# Patient Record
Sex: Female | Born: 1975 | Race: White | Hispanic: No | Smoking: Never smoker
Health system: Southern US, Community
[De-identification: ages and names within clinical notes are randomized; demographics above are authoritative.]

## PROBLEM LIST (undated history)

## (undated) DIAGNOSIS — R091 Pleurisy: Secondary | ICD-10-CM

## (undated) DIAGNOSIS — T300 Burn of unspecified body region, unspecified degree: Secondary | ICD-10-CM

## (undated) HISTORY — DX: Pleurisy: R09.1

## (undated) HISTORY — DX: Burn of unspecified body region, unspecified degree: T30.0

## (undated) HISTORY — PX: LEG SURGERY: SHX1003

---

## 1999-08-04 ENCOUNTER — Other Ambulatory Visit: Admission: RE | Admit: 1999-08-04 | Discharge: 1999-08-04 | Payer: Self-pay | Admitting: Obstetrics and Gynecology

## 2001-08-09 ENCOUNTER — Other Ambulatory Visit: Admission: RE | Admit: 2001-08-09 | Discharge: 2001-08-09 | Payer: Self-pay | Admitting: Obstetrics and Gynecology

## 2001-09-04 ENCOUNTER — Ambulatory Visit (HOSPITAL_COMMUNITY): Admission: RE | Admit: 2001-09-04 | Discharge: 2001-09-04 | Payer: Self-pay | Admitting: Obstetrics and Gynecology

## 2001-09-04 ENCOUNTER — Encounter: Payer: Self-pay | Admitting: Obstetrics and Gynecology

## 2002-01-26 ENCOUNTER — Inpatient Hospital Stay (HOSPITAL_COMMUNITY): Admission: AD | Admit: 2002-01-26 | Discharge: 2002-01-28 | Payer: Self-pay | Admitting: Obstetrics and Gynecology

## 2002-01-26 ENCOUNTER — Encounter (INDEPENDENT_AMBULATORY_CARE_PROVIDER_SITE_OTHER): Payer: Self-pay | Admitting: *Deleted

## 2005-04-21 ENCOUNTER — Other Ambulatory Visit: Admission: RE | Admit: 2005-04-21 | Discharge: 2005-04-21 | Payer: Self-pay | Admitting: Obstetrics and Gynecology

## 2007-05-22 ENCOUNTER — Emergency Department (HOSPITAL_COMMUNITY): Admission: EM | Admit: 2007-05-22 | Discharge: 2007-05-22 | Payer: Self-pay | Admitting: Emergency Medicine

## 2009-12-06 ENCOUNTER — Emergency Department: Payer: Self-pay | Admitting: Unknown Physician Specialty

## 2010-09-04 NOTE — Op Note (Signed)
Wendy Terry, Wendy Terry                         ACCOUNT NO.:  [id number]   MEDICAL RECORD NO.:  [id number]                   PATIENT TYPE:  INP   LOCATION:  9146                                 FACILITY:  [facility]   PHYSICIAN:  Huel Cote, M.D.              DATE OF BIRTH:  03-17-1976   DATE OF PROCEDURE:  01/26/2002  DATE OF DISCHARGE:                                 OPERATIVE REPORT   PREOPERATIVE DIAGNOSES:  1. Term pregnancy at 38 weeks.  2. Breech presentation.  3. Labor.   POSTOPERATIVE DIAGNOSES:  1. Term pregnancy at 38 weeks.  2. Breech presentation.  3. Labor.   PROCEDURE:  Primary low transverse cesarean section with bilateral tubal  ligation.   SURGEON:  Huel Cote, M.D.   ANESTHESIA:  Spinal.   ESTIMATED BLOOD LOSS:  800 cc.   URINE OUTPUT:  450 cc.   IV FLUIDS:  3000 cc.   FINDINGS:  There is a viable female infant in the complete breech  presentation.  Apgars were 8 and 9.  Weight was 8 pounds 4 ounces.  There  were normal uterus, tubes, and ovaries noted.  A bilateral tubal ligation  was done per the patient's request after confirmation with her.   PROCEDURE:  The patient was taken to the operating room where spinal  anesthesia was obtained without difficulty.  She was then prepped and draped  in the normal sterile fashion in the dorsal supine position with a leftward  tilt.  A Pfannenstiel skin incision was then made and carried through to the  underlying layer of fascia with a scalpel and Bovie cautery and the fascia  was then nicked in the midline and the incision was extended laterally with  Mayo scissors.  The inferior aspect of the fascia was then grasped with  Kocher clamps, elevated, and dissected off the underlying rectus muscles  with Bovie cautery.  In a similar fashion, the superior aspect was elevated  and dissected off the rectus muscles.  The rectus muscles were then  separated in the midline and the peritoneum entered bluntly.   Peritoneal  incision was then extended both superiorly and inferiorly with careful  attention to avoid both bowel and bladder.  The bladder blade was then  inserted.  The vesicouterine peritoneum identified, and a bladder flap  created with the Metzenbaum scissors.  The bladder blade was then inserted  under the bladder flap and the lower uterine segment incised in a transverse  fashion.  The infant's feet were then grasped and gentle traction placed  until the legs delivered up to the level of the sacrum.  Baby was then  rotated with fundal pressure and gentle traction and the arms reduced over  the infant's chest.  Finally, the head was held in a flexed position and  delivered without difficulty.  The cord was clamped and cut, baby bulb  suctioned and handed to the  pediatricians.  Cord blood was obtained and the  placenta was expressed manually.  The uterus was cleared of all clots and  debris with a moist lap sponge.  The uterine incision was then repaired with  0 chromic in a running locked fashion.  Good hemostasis was noted so  attention was turned to the patient's left fallopian tube which was  identified and grasped with a Babcock clamp and a small opening made in the  mesosalpinx with Bovie cautery.  A plain tie of 0 was then doubly placed  around approximately 2-3 buckle of tube and this was amputated with the  Metzenbaum scissors and handed off to pathology.  The freed pedicle was  cauterized with Bovie cautery and noted to be hemostatic.  Attention was  then turned to the right fallopian tube which in a similar fashion was  grasped with Babcock clamp, elevated, and a 2-3 cm segment tied off with two  ties of 0 plain.  This also was then amputated and handed to pathology and  the free pedicles cauterized with Bovie cautery.  Both were hemostatic and  returned to the abdomen.  The uterine incision was then carefully inspected  and found to be hemostatic.  The rectus muscles and  subfascial planes were  inspected and found to be hemostatic.  Therefore, the fascia was closed with  0 Vicryl in a running fashion.  The skin was closed with staples.  Sponge,  lap, and needle counts were correct x2 and the patient was taken to the  recovery room in stable condition.                                               Huel Cote, M.D.    KR/MEDQ  D:  01/26/2002  T:  01/26/2002  Job:  [id number]
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
## 2011-01-08 LAB — URINALYSIS, ROUTINE W REFLEX MICROSCOPIC
Bilirubin Urine: NEGATIVE
Glucose, UA: NEGATIVE
Hgb urine dipstick: NEGATIVE
Ketones, ur: NEGATIVE
Nitrite: NEGATIVE
Protein, ur: NEGATIVE
Specific Gravity, Urine: 1.019
Urobilinogen, UA: 1
pH: 7

## 2011-01-08 LAB — WET PREP, GENITAL
Clue Cells Wet Prep HPF POC: NONE SEEN
Trich, Wet Prep: NONE SEEN
WBC, Wet Prep HPF POC: NONE SEEN

## 2011-01-08 LAB — POCT PREGNANCY, URINE
Operator id: 277751
Preg Test, Ur: NEGATIVE

## 2016-10-17 ENCOUNTER — Encounter: Payer: Self-pay | Admitting: Emergency Medicine

## 2016-10-17 ENCOUNTER — Emergency Department: Payer: Self-pay

## 2016-10-17 ENCOUNTER — Emergency Department
Admission: EM | Admit: 2016-10-17 | Discharge: 2016-10-17 | Disposition: A | Discharge status: Home / Self Care | Payer: Self-pay | Attending: Emergency Medicine | Admitting: Emergency Medicine

## 2016-10-17 DIAGNOSIS — R0781 Pleurodynia: Secondary | ICD-10-CM | POA: Insufficient documentation

## 2016-10-17 LAB — BASIC METABOLIC PANEL
ANION GAP: 5 (ref 5–15)
BUN: 13 mg/dL (ref 6–20)
CHLORIDE: 105 mmol/L (ref 101–111)
CO2: 27 mmol/L (ref 22–32)
Calcium: 8.8 mg/dL — ABNORMAL LOW (ref 8.9–10.3)
Creatinine, Ser: 0.73 mg/dL (ref 0.44–1.00)
GFR calc Af Amer: >60 mL/min (ref >60)
GFR calc non Af Amer: >60 mL/min (ref >60)
Glucose, Bld: 102 mg/dL — ABNORMAL HIGH (ref 65–99)
POTASSIUM: 3.4 mmol/L — AB (ref 3.5–5.1)
Sodium: 137 mmol/L (ref 135–145)

## 2016-10-17 LAB — TROPONIN I

## 2016-10-17 LAB — CBC
HCT: 36.7 % (ref 35.0–47.0)
HEMOGLOBIN: 12.6 g/dL (ref 12.0–16.0)
MCH: 30.5 pg (ref 26.0–34.0)
MCHC: 34.3 g/dL (ref 32.0–36.0)
MCV: 88.9 fL (ref 80.0–100.0)
Platelets: 282 10*3/uL (ref 150–440)
RBC: 4.13 MIL/uL (ref 3.80–5.20)
RDW: 13.3 % (ref 11.5–14.5)
WBC: 10.1 10*3/uL (ref 3.6–11.0)

## 2016-10-17 LAB — POCT PREGNANCY, URINE: Preg Test, Ur: NEGATIVE

## 2016-10-17 MED ORDER — HYDROCODONE-ACETAMINOPHEN 5-325 MG PO TABS: 1.0000 | ORAL_TABLET | ORAL | 0 refills | Status: DC | PRN

## 2016-10-17 MED ORDER — METHYLPREDNISOLONE SODIUM SUCC 125 MG IJ SOLR
INTRAMUSCULAR | Status: AC
  Administered 2016-10-17: 125 mg via INTRAVENOUS
  Filled 2016-10-17: qty 2

## 2016-10-17 MED ORDER — METHYLPREDNISOLONE SODIUM SUCC 125 MG IJ SOLR
125.0000 mg | Freq: Once | INTRAMUSCULAR | Status: AC
  Administered 2016-10-17: 125 mg via INTRAVENOUS

## 2016-10-17 MED ORDER — METHYLPREDNISOLONE SODIUM SUCC 125 MG IJ SOLR: 125.0000 mg | Freq: Once | INTRAMUSCULAR | 0 refills | Status: DC

## 2016-10-17 MED ORDER — PREDNISONE 10 MG (21) PO TBPK: ORAL_TABLET | ORAL | 0 refills | Status: DC

## 2016-10-17 MED ORDER — IOPAMIDOL (ISOVUE-370) INJECTION 76%
75.0000 mL | Freq: Once | INTRAVENOUS | Status: AC | PRN
  Administered 2016-10-17: 75 mL via INTRAVENOUS
  Filled 2016-10-17: qty 75
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
## 2016-10-17 NOTE — ED Triage Notes (Signed)
C/O mid back pain that radiates toward sternum.  [facility] and [facility] rides.  States mid back pain started first and then pain radiated toward sternum.  States pain worsens with movement and cough and deep breathing.

## 2016-10-17 NOTE — Discharge Instructions (Signed)
Please take the medications as prescribed. Follow up with the primary care provider of your choice when possible. Return to the ER for symptoms that change or worsen if unable to schedule an appointment.

## 2016-10-17 NOTE — ED Notes (Signed)
Patient transported to CT 

## 2017-03-01 ENCOUNTER — Ambulatory Visit (INDEPENDENT_AMBULATORY_CARE_PROVIDER_SITE_OTHER): Payer: Worker's Compensation | Admitting: Family Medicine

## 2017-03-01 ENCOUNTER — Other Ambulatory Visit: Payer: Self-pay

## 2017-03-01 ENCOUNTER — Encounter: Payer: Self-pay | Admitting: Family Medicine

## 2017-03-01 VITALS — BP 104/68 | HR 90 | Temp 98.0°F | Resp 12 | Ht 63.0 in | Wt 154.0 lb

## 2017-03-01 DIAGNOSIS — M79672 Pain in left foot: Secondary | ICD-10-CM | POA: Diagnosis not present

## 2017-03-01 DIAGNOSIS — T22292A Burn of second degree of multiple sites of left shoulder and upper limb, except wrist and hand, initial encounter: Secondary | ICD-10-CM

## 2017-03-01 DIAGNOSIS — M25522 Pain in left elbow: Secondary | ICD-10-CM

## 2017-03-01 DIAGNOSIS — T25229A Burn of second degree of unspecified foot, initial encounter: Secondary | ICD-10-CM

## 2017-03-01 MED ORDER — HYDROCODONE-ACETAMINOPHEN 5-325 MG PO TABS: 1.0000 | ORAL_TABLET | ORAL | 0 refills | Status: DC | PRN

## 2017-03-01 NOTE — Progress Notes (Addendum)
Subjective:  By signing my name below, I, Wendy Terry, attest that this documentation has been prepared under the direction and in the presence of [name], MD Electronically Signed: Charline Bills, ED Scribe 03/01/2017 at 3:45 PM.   Patient ID: Wendy Terry, female    DOB: 11-Dec-1975, 41 y.o.   MRN: [id number]  Chief Complaint  Patient presents with  . Burn    x6 days ago. Burnt left arm and left foot, some of right foot. Out of cream, out of pain medication. Very painful at burn sites. Rates pain at a 10/10. Needles/burning pain sensation. Burnt self at work making transferring apple cider to customer pot. Pot was jerked out of patient hand, dumped down left side. Ambulance transferred patient to [facility] ED. Returns to work on 11/19- would like work note and detailed wound care instructions.      HPI Wendy Terry is a 41 y.o. female who presents to Primary Care at [facility] complaining of multiple wounds that she sustained at work. Burns occurred 6 days ago while working at the [employer]. Pt states that she was making boiling apple cider samples to a customer pot when the cord fell and caught on the wheel, dumping the cider onto her L arm, L foot, R foot and splashing cider in her hair. She states the cider was unplugged but she could see bubbling within the cider even while it was unplugged and in the [employer]. Pt was wearing a long-sleeve with t-shirt, thin socks and tennis shoes during the time of the incident. EMS transported pt to [facility] where she was evaluated by a burn specialist and she was discharged home. Pt's tetanus was updated and she was started on silvadene cream twice daily and washes the area once daily. Pt has a picture on her cell phone with some scatted blistering across the upper and lower forearm. States she did notice some immediate blistering to the L foot but states she has recently noticed more blisters and drainage from the burn to the L foot over  the past few days. Reports increased pain to the L foot that she describes as a burning sensation with bearing weight. Also reports blistering to the L arm, small burn to the R great toe and minimal burn that she describes a "sun burn" to the face. Denies burns to mouth or eyes, sob. Pt is treating pain with Norco 5-325 x 4 times daily and ibuprofen 600 mg every 6 hours. Denies h/o substance abuse or difficulty stopping narcotic pain medication.  Current Outpatient Medications on File Prior to Visit  Medication Sig Dispense Refill  . BIOTIN PO Take daily by mouth.    Marland Kitchen HYDROcodone-acetaminophen (NORCO/VICODIN) 5-325 MG tablet Take 1 tablet by mouth every 4 (four) hours as needed for moderate pain. 20 tablet 0  . ibuprofen (ADVIL,MOTRIN) 200 MG tablet Take 200 mg 2 (two) times daily as needed by mouth.    . Multiple Vitamins-Minerals (MULTIVITAMIN PO) Take daily by mouth.    . silver sulfADIAZINE (SILVADENE) 1 % cream Apply 1 application 2 (two) times daily topically.    . predniSONE (STERAPRED UNI-PAK 21 TAB) 10 MG (21) TBPK tablet Take 6 tablets on day 1 Take 5 tablets on day 2 Take 4 tablets on day 3 Take 3 tablets on day 4 Take 2 tablets on day 5 Take 1 tablet on day 6 (Patient not taking: Reported on 03/01/2017) 21 tablet 0   No current facility-administered medications on file prior  to visit.    No Known Allergies  Review of Systems  HENT: Negative for mouth sores.   Eyes: Negative for pain, redness and visual disturbance.  Respiratory: Negative for shortness of breath and wheezing.   Skin: Positive for color change and wound.      Objective:   Physical Exam  Constitutional: She is oriented to person, place, and time. She appears well-developed and well-nourished. No distress.  HENT:  Head: Normocephalic and atraumatic.  Eyes: Conjunctivae and EOM are normal.  Neck: Neck supple. No tracheal deviation present.  Cardiovascular: Normal rate.  Pulmonary/Chest: Effort normal. No  respiratory distress.  Musculoskeletal: Normal range of motion.  Neurological: She is alert and oriented to person, place, and time.  Skin: Skin is warm and dry. Burn noted.  Face: no facial wounds or burns L UE: appears to be healing burn across the medial aspect and volar aspect of upper and volar L forearm. Some areas with overlying blister that has ruptured. L hand is uninvolved.  L foot: small erythematous patch over lateral malleolus. Larger patches of erythema with larger blister into dorsal toes 1-5. Some overlying blister over 1-5 toes proximal to nail. Distal toes are uninvolved. Slight soft tissue swelling of the 2nd-4th toes. Cap refill less than 1 second. Tips of toes are warm. R foot: patch of erythema overlying the great toe to the MTP joint. Remainder of the lower legs are uninvolved.  Psychiatric: She has a normal mood and affect. Her behavior is normal.  Nursing note and vitals reviewed.        Vitals:   03/01/17 1512  BP: 104/68  Pulse: 90  Resp: 12  Temp: 98 F (36.7 C)  TempSrc: Oral  SpO2: 97%  Weight: 154 lb (69.9 kg)  Height: 5\' 3"  (1.6 m)      Assessment & Plan:    Wendy OverlandJasmine N Pandit is a 41 y.o. female Arthralgia of left upper arm  Left foot pain  Partial thickness burn of multiple sites of left upper extremity, initial encounter  Burn, foot, second degree, unspecified laterality, initial encounter  Partial-thickness burns with resultant pain to left arm, left foot, small amount to right foot, and reportedly mild burn of left side of face that has now resolved. Injuries due to incident while at work. She does report receiving tetanus at the ER visit. She also was [facility] with plan for outpatient treatment. Most blisters across arm have ruptured, remaining blister across dorsal foot. No apparent signs of infection in wounds at this time. Fair pain control, but has only been using one hydrocodone at a time  has been continuing ibuprofen 600 mg.   -Gently cleansed wound of foot with reapplication of Silvadene to foot and arm wounds. Discussed wound care of cleansing area 1-2 times per day with reapplication of Silvadene, Telfa and Kerlix/gauze bandages   -For pain control, Continue ibuprofen 600 mg every 6 hours with food, but can increase hydrocodone up to 2 at a time if needed, new prescription provided. Potential side effects discussed.  -Agree with continuing to remain out of work at this time with repeat evaluation in 3 days.   -RTC/ER precautions discussed if worsening  Meds ordered this encounter  Medications  . ibuprofen (ADVIL,MOTRIN) 200 MG tablet    Sig: Take 200 mg 2 (two) times daily as needed by mouth.  . DISCONTD: silver sulfADIAZINE (SILVADENE) 1 % cream    Sig: Apply 1 application 2 (two)  times daily topically.  . Multiple Vitamins-Minerals (MULTIVITAMIN PO)    Sig: Take daily by mouth.  Marland Kitchen. BIOTIN PO    Sig: Take daily by mouth.  Marland Kitchen. HYDROcodone-acetaminophen (NORCO/VICODIN) 5-325 MG tablet    Sig: Take 1-2 tablets every 4 (four) hours as needed by mouth for moderate pain.    Dispense:  30 tablet    Refill:  0   Patient Instructions    Continue cleansing with soap and water twice per day, then silvadene cream.  Ibuprofen 600mg  every 6 hours, then hydrocodone up to every 4 hours as needed. recheck in 3 days to repeat exam on foot. Return to the clinic or go to the nearest emergency room if any of your symptoms worsen or new symptoms occur.   Burn Care, Adult A burn is an injury to the skin or the tissues under the skin. There are three types of burns:  First degree. These burns may cause the skin to be red and slightly swollen.  Second degree. These burns are very painful and cause the skin to be very red. The skin may also leak fluid, look shiny, and develop blisters.  Third degree. These burns cause permanent damage. They either turn the skin white or black and make it  look charred, dry, and leathery.  Taking care of your burn properly can help to prevent pain and infection. It can also help the burn to heal more quickly. What are the risks? Complications from burns include:  Damage to the skin.  Reduced blood flow near the injury.  Dead tissue.  Scarring.  Problems with movement, if the burn happened near a joint or on the hands or feet.  Severe burns can lead to problems that affect the whole body, such as:  Fluid loss.  Less blood circulating in the body.  Inability to maintain a normal core body temperature (thermoregulation).  Infection.  Shock.  Problems breathing.  How to care for a first-degree burn Right after a burn:  Rinse or soak the burn under cool water until the pain stops. Do not put ice on your burn. This can cause more damage.  Lightly cover the burn with a sterile cloth (dressing). Burn care  Follow instructions from your health care provider about: ? How to clean and take care of the burn. ? When to change and remove the dressing.  Check your burn every day for signs of infection. Check for: ? More redness, swelling, or pain. ? Warmth. ? Pus or a bad smell. Medicine  Take over-the-counter and prescription medicines only as told by your health care provider.  If you were prescribed antibiotic medicine, take or apply it as told by your health care provider. Do not stop using the antibiotic even if your condition improves. General instructions  To prevent infection, do not put butter, oil, or other home remedies on your burn.  Do not rub your burn, even when you are cleaning it.  Protect your burn from the sun. How to care for a second-degree burn Right after a burn:  Rinse or soak the burn under cool water. Do this for several minutes. Do not put ice on your burn. This can cause more damage.  Lightly cover the burn with a sterile cloth (dressing). Burn care  Raise (elevate) the injured area above  the level of your heart while sitting or lying down.  Follow instructions from your health care provider about: ? How to clean and take care of the burn. ?  When to change and remove the dressing.  Check your burn every day for signs of infection. Check for: ? More redness, swelling, or pain. ? Warmth. ? Pus or a bad smell. Medicine   Take over-the-counter and prescription medicines only as told by your health care provider.  If you were prescribed antibiotic medicine, take or apply it as told by your health care provider. Do not stop using the antibiotic even if your condition improves. General instructions  To prevent infection: ? Do not put butter, oil, or other home remedies on the burn. ? Do not scratch or pick at the burn. ? Do not break any blisters. ? Do not peel skin.  Do not rub your burn, even when you are cleaning it.  Protect your burn from the sun. How to care for a third-degree burn Right after a burn:  Lightly cover the burn with gauze.  Seek immediate medical attention. Burn care  Raise (elevate) the injured area above the level of your heart while sitting or lying down.  Drink enough fluid to keep your urine clear or pale yellow.  Rest as told by your health care provider. Do not participate in sports or other physical activities until your health care provider approves.  Follow instructions from your health care provider about: ? How to clean and take care of the burn. ? When to change and remove the dressing.  Check your burn every day for signs of infection. Check for: ? More redness, swelling, or pain. ? Warmth. ? Pus or a bad smell. Medicine  Take over-the-counter and prescription medicines only as told by your health care provider.  If you were prescribed antibiotic medicine, take or apply it as told by your health care provider. Do not stop using the antibiotic even if your condition improves. General instructions  To prevent  infection: ? Do not put butter, oil, or other home remedies on the burn. ? Do not scratch or pick at the burn. ? Do not break any blisters. ? Do not peel skin. ? Do not rub your burn, even when you are cleaning it.  Protect your burn from the sun.  Keep all follow-up visits as told by your health care provider. This is important. Contact a health care provider if:  Your condition does not improve.  Your condition gets worse.  You have a fever.  Your burn changes in appearance or develops black or red spots.  Your burn feels warm to the touch.  Your pain is not controlled with medicine. Get help right away if:  You have redness, swelling, or pain at the site of the burn.  You have fluid, blood, or pus coming from your burn.  You have red streaks near the burn.  You have severe pain. This information is not intended to replace advice given to you by your health care provider. Make sure you discuss any questions you have with your health care provider. Document Released: 04/05/2005 Document Revised: 10/26/2015 Document Reviewed: 09/23/2015 Elsevier Interactive Patient Education  2018 [employer].     IF you received an x-ray today, you will receive an invoice from [facility]. Please contact [facility] at [phone] with questions or concerns regarding your invoice.   IF you received labwork today, you will receive an invoice from [place]. Please contact [facility] at [phone] with questions or concerns regarding your invoice.   Our billing staff will not be able to assist you with questions regarding bills from these companies.  You will be contacted with the lab results as soon as they are available. The fastest way to get your results is to activate your My Chart account. Instructions are located on the last page of this paperwork. If you have not heard from [place] regarding the results in 2 weeks, please contact this office.      I personally  performed the services described in this documentation, which was scribed in my presence. The recorded information has been reviewed and considered for accuracy and completeness, addended by me as needed, and agree with information above.  Signed,   Meredith Staggers, MD Primary Care at [facility].  03/01/17 4:19 PM

## 2017-03-01 NOTE — Patient Instructions (Addendum)
Continue cleansing with soap and water twice per day, then silvadene cream.  Ibuprofen 600mg  every 6 hours, then hydrocodone up to every 4 hours as needed. recheck in 3 days to repeat exam on foot. Return to the clinic or go to the nearest emergency room if any of your symptoms worsen or new symptoms occur.   Burn Care, Adult A burn is an injury to the skin or the tissues under the skin. There are three types of burns:  First degree. These burns may cause the skin to be red and slightly swollen.  Second degree. These burns are very painful and cause the skin to be very red. The skin may also leak fluid, look shiny, and develop blisters.  Third degree. These burns cause permanent damage. They either turn the skin white or black and make it look charred, dry, and leathery.  Taking care of your burn properly can help to prevent pain and infection. It can also help the burn to heal more quickly. What are the risks? Complications from burns include:  Damage to the skin.  Reduced blood flow near the injury.  Dead tissue.  Scarring.  Problems with movement, if the burn happened near a joint or on the hands or feet.  Severe burns can lead to problems that affect the whole body, such as:  Fluid loss.  Less blood circulating in the body.  Inability to maintain a normal core body temperature (thermoregulation).  Infection.  Shock.  Problems breathing.  How to care for a first-degree burn Right after a burn:  Rinse or soak the burn under cool water until the pain stops. Do not put ice on your burn. This can cause more damage.  Lightly cover the burn with a sterile cloth (dressing). Burn care  Follow instructions from your health care provider about: ? How to clean and take care of the burn. ? When to change and remove the dressing.  Check your burn every day for signs of infection. Check for: ? More redness, swelling, or pain. ? Warmth. ? Pus or a bad  smell. Medicine  Take over-the-counter and prescription medicines only as told by your health care provider.  If you were prescribed antibiotic medicine, take or apply it as told by your health care provider. Do not stop using the antibiotic even if your condition improves. General instructions  To prevent infection, do not put butter, oil, or other home remedies on your burn.  Do not rub your burn, even when you are cleaning it.  Protect your burn from the sun. How to care for a second-degree burn Right after a burn:  Rinse or soak the burn under cool water. Do this for several minutes. Do not put ice on your burn. This can cause more damage.  Lightly cover the burn with a sterile cloth (dressing). Burn care  Raise (elevate) the injured area above the level of your heart while sitting or lying down.  Follow instructions from your health care provider about: ? How to clean and take care of the burn. ? When to change and remove the dressing.  Check your burn every day for signs of infection. Check for: ? More redness, swelling, or pain. ? Warmth. ? Pus or a bad smell. Medicine   Take over-the-counter and prescription medicines only as told by your health care provider.  If you were prescribed antibiotic medicine, take or apply it as told by your health care provider. Do not stop using the antibiotic even if  your condition improves. General instructions  To prevent infection: ? Do not put butter, oil, or other home remedies on the burn. ? Do not scratch or pick at the burn. ? Do not break any blisters. ? Do not peel skin.  Do not rub your burn, even when you are cleaning it.  Protect your burn from the sun. How to care for a third-degree burn Right after a burn:  Lightly cover the burn with gauze.  Seek immediate medical attention. Burn care  Raise (elevate) the injured area above the level of your heart while sitting or lying down.  Drink enough fluid to keep  your urine clear or pale yellow.  Rest as told by your health care provider. Do not participate in sports or other physical activities until your health care provider approves.  Follow instructions from your health care provider about: ? How to clean and take care of the burn. ? When to change and remove the dressing.  Check your burn every day for signs of infection. Check for: ? More redness, swelling, or pain. ? Warmth. ? Pus or a bad smell. Medicine  Take over-the-counter and prescription medicines only as told by your health care provider.  If you were prescribed antibiotic medicine, take or apply it as told by your health care provider. Do not stop using the antibiotic even if your condition improves. General instructions  To prevent infection: ? Do not put butter, oil, or other home remedies on the burn. ? Do not scratch or pick at the burn. ? Do not break any blisters. ? Do not peel skin. ? Do not rub your burn, even when you are cleaning it.  Protect your burn from the sun.  Keep all follow-up visits as told by your health care provider. This is important. Contact a health care provider if:  Your condition does not improve.  Your condition gets worse.  You have a fever.  Your burn changes in appearance or develops black or red spots.  Your burn feels warm to the touch.  Your pain is not controlled with medicine. Get help right away if:  You have redness, swelling, or pain at the site of the burn.  You have fluid, blood, or pus coming from your burn.  You have red streaks near the burn.  You have severe pain. This information is not intended to replace advice given to you by your health care provider. Make sure you discuss any questions you have with your health care provider. Document Released: 04/05/2005 Document Revised: 10/26/2015 Document Reviewed: 09/23/2015 Elsevier Interactive Patient Education  2018 [employer]     IF you received an x-ray  today, you will receive an invoice from [facility]. Please contact [facility] at [phone] with questions or concerns regarding your invoice.   IF you received labwork today, you will receive an invoice from [facility]. Please contact [facility] at [phone] with questions or concerns regarding your invoice.   Our billing staff will not be able to assist you with questions regarding bills from these companies.  You will be contacted with the lab results as soon as they are available. The fastest way to get your results is to activate your My Chart account. Instructions are located on the last page of this paperwork. If you have not heard from us regarding the results in 2 weeks, please contact this office.

## 2017-03-04 ENCOUNTER — Telehealth: Payer: Self-pay

## 2017-03-04 ENCOUNTER — Encounter: Payer: Self-pay | Admitting: Family Medicine

## 2017-03-04 ENCOUNTER — Ambulatory Visit (INDEPENDENT_AMBULATORY_CARE_PROVIDER_SITE_OTHER): Payer: Worker's Compensation | Admitting: Family Medicine

## 2017-03-04 VITALS — BP 118/70 | HR 90 | Temp 98.1°F | Resp 17 | Ht 63.0 in | Wt 153.0 lb

## 2017-03-04 DIAGNOSIS — M79672 Pain in left foot: Secondary | ICD-10-CM | POA: Diagnosis not present

## 2017-03-04 DIAGNOSIS — T22292D Burn of second degree of multiple sites of left shoulder and upper limb, except wrist and hand, subsequent encounter: Secondary | ICD-10-CM | POA: Diagnosis not present

## 2017-03-04 DIAGNOSIS — T25222D Burn of second degree of left foot, subsequent encounter: Secondary | ICD-10-CM

## 2017-03-04 NOTE — Patient Instructions (Addendum)
  Continue Silvadene with wound cleansing 1-2 times per day, ibuprofen, and hydrocodone if needed. You can gently remove the dead skin around the wounds. Recheck in 4 days.  Return to the clinic or go to the nearest emergency room if any of your symptoms worsen or new symptoms occur.   IF you received an x-ray today, you will receive an invoice from [facility]. Please contact [facility] at [phone] with questions or concerns regarding your invoice.   IF you received labwork today, you will receive an invoice from [facility]. Please contact [facility] at [phone] with questions or concerns regarding your invoice.   Our billing staff will not be able to assist you with questions regarding bills from these companies.  You will be contacted with the lab results as soon as they are available. The fastest way to get your results is to activate your My Chart account. Instructions are located on the last page of this paperwork. If you have not heard from us regarding the results in 2 weeks, please contact this office.

## 2017-03-04 NOTE — Telephone Encounter (Signed)
Copied from CRM [phone]. Topic: Medical Record Request - Provider/Facility Request >> Mar 04, 2017  2:35 PM Terisa Starraylor, Brittany L wrote: Patient Name/DOB/MRN #: Wendy Terry 1975/07/12, [id number] Requestor Name/Agency: [facility] Call Back #: fax [phone] Information Requested: Work Status & Notes from visit   Route to [facility] Pool for [facility]. For all other clinics, route to the clinic's PEC Pool.
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
## 2017-03-05 ENCOUNTER — Encounter: Payer: Self-pay | Admitting: Family Medicine

## 2017-03-08 ENCOUNTER — Other Ambulatory Visit: Payer: Self-pay

## 2017-03-08 ENCOUNTER — Ambulatory Visit (INDEPENDENT_AMBULATORY_CARE_PROVIDER_SITE_OTHER): Payer: Worker's Compensation | Admitting: Family Medicine

## 2017-03-08 ENCOUNTER — Encounter: Payer: Self-pay | Admitting: Family Medicine

## 2017-03-08 VITALS — BP 116/68 | HR 91 | Resp 16 | Ht 63.0 in | Wt 153.0 lb

## 2017-03-08 DIAGNOSIS — T25222D Burn of second degree of left foot, subsequent encounter: Secondary | ICD-10-CM | POA: Diagnosis not present

## 2017-03-08 DIAGNOSIS — L03119 Cellulitis of unspecified part of limb: Secondary | ICD-10-CM

## 2017-03-08 DIAGNOSIS — M25572 Pain in left ankle and joints of left foot: Secondary | ICD-10-CM | POA: Diagnosis not present

## 2017-03-08 DIAGNOSIS — T22292D Burn of second degree of multiple sites of left shoulder and upper limb, except wrist and hand, subsequent encounter: Secondary | ICD-10-CM | POA: Diagnosis not present

## 2017-03-08 DIAGNOSIS — M79602 Pain in left arm: Secondary | ICD-10-CM

## 2017-03-08 MED ORDER — HYDROCODONE-ACETAMINOPHEN 5-325 MG PO TABS: 1.0000 | ORAL_TABLET | ORAL | 0 refills | Status: DC | PRN

## 2017-03-08 MED ORDER — SILVER SULFADIAZINE 1 % EX CREA: 1.0000 "application " | TOPICAL_CREAM | Freq: Every day | CUTANEOUS | 0 refills | Status: DC

## 2017-03-08 NOTE — Patient Instructions (Addendum)
Left arm is looking much better, but unfortunately the burn on your left foot appears to be getting infected. Due to potential need for debridement and pain control for that debridement I would recommend proceeding to [facility] emergency room. You can call the burn center first to see if they have other specific instructions. I did not start you on an antibiotic today as that decision and type of antibiotic can be decided by provider treating your wound tonight.    For arm -  continue Silvadene cream twice per day for next 3 days after cleansing with sopa and water. As open area closes, change to over the counter polysporin. Continue ibuprofen 600 mg every 6 hours as needed, continue hydrocodone 1-2 pills every 6 hours as needed.   Use crutches to keep weight off foot, recheck in 3 days.   IF you received an x-ray today, you will receive an invoice from [facility]. Please contact [facility] at [phone] with questions or concerns regarding your invoice.   IF you received labwork today, you will receive an invoice from [facility]. Please contact [facility] at [phone] with questions or concerns regarding your invoice.   Our billing staff will not be able to assist you with questions regarding bills from these companies.  You will be contacted with the lab results as soon as they are available. The fastest way to get your results is to activate your My Chart account. Instructions are located on the last page of this paperwork. If you have not heard from us regarding the results in 2 weeks, please contact this office.
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
## 2017-03-09 ENCOUNTER — Other Ambulatory Visit: Payer: Self-pay | Admitting: Family Medicine

## 2017-03-09 ENCOUNTER — Encounter: Payer: Self-pay | Admitting: Family Medicine

## 2017-03-09 NOTE — Telephone Encounter (Signed)
Records were sent to them on 03/09/17

## 2017-03-11 ENCOUNTER — Other Ambulatory Visit: Payer: Self-pay

## 2017-03-11 ENCOUNTER — Encounter: Payer: Self-pay | Admitting: Family Medicine

## 2017-03-11 ENCOUNTER — Ambulatory Visit (INDEPENDENT_AMBULATORY_CARE_PROVIDER_SITE_OTHER): Payer: Worker's Compensation | Admitting: Family Medicine

## 2017-03-11 VITALS — BP 102/68 | HR 89 | Temp 98.0°F | Resp 16

## 2017-03-11 DIAGNOSIS — T22292D Burn of second degree of multiple sites of left shoulder and upper limb, except wrist and hand, subsequent encounter: Secondary | ICD-10-CM

## 2017-03-11 DIAGNOSIS — M25572 Pain in left ankle and joints of left foot: Secondary | ICD-10-CM

## 2017-03-11 DIAGNOSIS — T25222D Burn of second degree of left foot, subsequent encounter: Secondary | ICD-10-CM | POA: Diagnosis not present

## 2017-03-11 DIAGNOSIS — T25221D Burn of second degree of right foot, subsequent encounter: Secondary | ICD-10-CM | POA: Diagnosis not present

## 2017-03-11 MED ORDER — IBUPROFEN 600 MG PO TABS: 600.0000 mg | ORAL_TABLET | Freq: Three times a day (TID) | ORAL | 0 refills | Status: DC | PRN

## 2017-03-11 MED ORDER — SILVER SULFADIAZINE 1 % EX CREA: 1.0000 "application " | TOPICAL_CREAM | Freq: Every day | CUTANEOUS | 0 refills | Status: DC

## 2017-03-11 MED ORDER — DOUBLE ANTIBIOTIC 500-10000 UNIT/GM EX OINT: 1.0000 "application " | TOPICAL_OINTMENT | Freq: Two times a day (BID) | CUTANEOUS | 0 refills | Status: DC

## 2017-03-11 NOTE — Patient Instructions (Addendum)
  Continue Silvadene to foot with cleansing 1-2 times per day, Silvadene to small slightly open area on elbow for the next few days, then change over to Polysporin. Can use Polysporin on other areas of left arm at this time. After cleansing, can gently trim dead skin from top of foot.   Continue ibuprofen 600 mg every 6 hours as needed, hydrocodone if needed for breakthrough pain. Follow-up in one week with Dr. Alvy BimlerSagardia  Return to the clinic or go to the nearest emergency room if any of your symptoms worsen or new symptoms occur.    IF you received an x-ray today, you will receive an invoice from [facility]. Please contact [facility] at [phone] with questions or concerns regarding your invoice.   IF you received labwork today, you will receive an invoice from [facility]. Please contact [facility] at [phone] with questions or concerns regarding your invoice.   Our billing staff will not be able to assist you with questions regarding bills from these companies.  You will be contacted with the lab results as soon as they are available. The fastest way to get your results is to activate your My Chart account. Instructions are located on the last page of this paperwork. If you have not heard from us regarding the results in 2 weeks, please contact this office.
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
## 2017-03-13 ENCOUNTER — Encounter: Payer: Self-pay | Admitting: Family Medicine

## 2017-03-14 ENCOUNTER — Telehealth: Payer: Self-pay

## 2017-03-14 NOTE — Telephone Encounter (Signed)
Copied from CRM [phone]. Topic: General - Other >> Mar 14, 2017 12:27 PM Windy KalataMichael, Taylor L, NT wrote: Reason for CRM: Georga HackingCary calling from workers comp would like the notes faxed to her from the appt on 11/23.   Fax number [phone]

## 2017-03-15 NOTE — Telephone Encounter (Signed)
Records faxed over on 03/15/17

## 2017-03-17 ENCOUNTER — Other Ambulatory Visit: Payer: Self-pay

## 2017-03-17 ENCOUNTER — Ambulatory Visit (INDEPENDENT_AMBULATORY_CARE_PROVIDER_SITE_OTHER): Payer: Worker's Compensation | Admitting: Emergency Medicine

## 2017-03-17 ENCOUNTER — Encounter: Payer: Self-pay | Admitting: Emergency Medicine

## 2017-03-17 VITALS — BP 100/60 | HR 92 | Temp 98.6°F | Resp 16 | Wt 156.8 lb

## 2017-03-17 DIAGNOSIS — T25222D Burn of second degree of left foot, subsequent encounter: Secondary | ICD-10-CM

## 2017-03-17 DIAGNOSIS — T22292D Burn of second degree of multiple sites of left shoulder and upper limb, except wrist and hand, subsequent encounter: Secondary | ICD-10-CM

## 2017-03-17 DIAGNOSIS — M25572 Pain in left ankle and joints of left foot: Secondary | ICD-10-CM | POA: Diagnosis not present

## 2017-03-17 DIAGNOSIS — T22292A Burn of second degree of multiple sites of left shoulder and upper limb, except wrist and hand, initial encounter: Secondary | ICD-10-CM

## 2017-03-17 HISTORY — DX: Burn of second degree of multiple sites of left shoulder and upper limb, except wrist and hand, initial encounter: T22.292A

## 2017-03-17 HISTORY — DX: Burn of second degree of left foot, subsequent encounter: T25.222D

## 2017-03-17 MED ORDER — SILVER SULFADIAZINE 1 % EX CREA
TOPICAL_CREAM | Freq: Once | CUTANEOUS | Status: AC
  Administered 2017-03-17: 14:00:00 via TOPICAL
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
## 2017-03-17 NOTE — Patient Instructions (Addendum)
   IF you received an x-ray today, you will receive an invoice from [facility]. Please contact [facility] at [phone] with questions or concerns regarding your invoice.   IF you received labwork today, you will receive an invoice from [facility]. Please contact [facility] at [phone] with questions or concerns regarding your invoice.   Our billing staff will not be able to assist you with questions regarding bills from these companies.  You will be contacted with the lab results as soon as they are available. The fastest way to get your results is to activate your My Chart account. Instructions are located on the last page of this paperwork. If you have not heard from us regarding the results in 2 weeks, please contact this office.     Burn Care, Adult A burn is an injury to the skin or the tissues under the skin. There are three types of burns:  First degree. These burns may cause the skin to be red and a bit swollen.  Second degree. These burns are very painful and cause the skin to be very red. The skin may also leak fluid, look shiny, and start to have blisters.  Third degree. These burns cause permanent damage. They turn the skin white or black and make it look charred, dry, and leathery.  Taking care of your burn properly can help to prevent pain and infection. It can also help the burn to heal more quickly. How is this treated? Right after a burn:  Lightly cover the burn with gauze.  Get medical help right away. Burn care  Raise (elevate) the injured area above the level of your heart while sitting or lying down.  Drink enough fluid to keep your pee (urine) clear or pale yellow.  Rest as told by your doctor. Do not start playing sports or doing other physical activities until your doctor says it is okay.  Follow instructions from your doctor about: ? How to clean and take care of the burn. ? When to change and remove the bandage  (dressing).  Check your burn every day for signs of infection. Check for: ? More redness, swelling, or pain. ? Warmth. ? Pus or a bad smell. Medicine  Take over-the-counter and prescription medicines only as told by your doctor.  If you were prescribed antibiotic medicine, take or apply it as told by your doctor. Do not stop using the antibiotic even if your condition improves. General instructions  To prevent infection: ? Do not put butter, oil, or other home treatments on the burn. ? Do not scratch or pick at the burn. ? Do not break any blisters. ? Do not peel skin.  Do not rub your burn, even when you are cleaning it.  Protect your burn from the sun.  Keep all follow-up visits as told by your doctor. This is important. Contact a doctor if:  Your condition does not get better.  Your condition gets worse.  You have a fever.  Your burn looks different or starts to have black or red spots on it.  Your burn feels warm to the touch.  Your pain is not controlled with medicine. Get help right away if:  You have redness, swelling, or pain at the site of the burn.  You have fluid, blood, or pus coming from your burn.  You have red streaks near the burn.  You have very bad pain. How is this treated? Right after a burn:  Rinse or soak the burn   under cool water until the pain stops. Do not put ice on your burn. That can cause more damage.  Lightly cover the burn with a clean (sterile) cloth (dressing). Burn care  Follow instructions from your doctor about: ? How to clean and take care of the burn. ? When to change and remove the cloth.  Check your burn every day for signs of infection. Check for: ? More redness, swelling, or pain. ? Warmth. ? Pus or a bad smell. Medicine  Take over-the-counter and prescription medicines only as told by your doctor.  If you were prescribed antibiotic medicine, take or apply it as told by your doctor. Do not stop using the  antibiotic even if your condition improves. General instructions  To prevent infection, do not put butter, oil, or other home treatments on your burn.  Do not rub your burn, even when you are cleaning it.  Protect your burn from the sun. How is this treated? Right after a burn:  Rinse or soak the burn under cool water. Do this for several minutes. Do not put ice on your burn. That can cause more damage.  Lightly cover the burn with a clean (sterile) cloth (dressing). Burn care  Raise (elevate) the injured area above the level of your heart while sitting or lying down.  Follow instructions from your doctor about: ? How to clean and take care of the burn. ? When to change and remove the cloth.  Check your burn every day for signs of infection. Check for: ? More redness, swelling, or pain. ? Warmth. ? Pus or a bad smell. Medicine   Take over-the-counter and prescription medicines only as told by your doctor.  If you were prescribed antibiotic medicine, take or apply it as told by your doctor. Do not stop using the antibiotic even if your condition improves. General instructions  To prevent infection: ? Do not put butter, oil, or other home treatments on the burn. ? Do not scratch or pick at the burn. ? Do not break any blisters. ? Do not peel skin.  Do not rub your burn, even when you are cleaning it.  Protect your burn from the sun. This information is not intended to replace advice given to you by your health care provider. Make sure you discuss any questions you have with your health care provider. Document Released: 01/13/2008 Document Revised: 10/26/2015 Document Reviewed: 09/23/2015 Elsevier Interactive Patient Education  2017 Elsevier Inc.  

## 2017-03-22 ENCOUNTER — Telehealth: Payer: Self-pay

## 2017-03-22 ENCOUNTER — Ambulatory Visit: Payer: Self-pay | Admitting: Family Medicine

## 2017-03-22 NOTE — Telephone Encounter (Signed)
Copied from CRM [phone]. Topic: General - Other >> Mar 22, 2017  3:26 PM Cipriano BunkerLambe, Annette S wrote: Josph MachoCary, [employer] [facility] [employer] [phone]  - request work status May fax

## 2017-03-23 NOTE — Telephone Encounter (Signed)
Records were faxed to [employer] on 03/10/17

## 2017-03-24 ENCOUNTER — Ambulatory Visit (INDEPENDENT_AMBULATORY_CARE_PROVIDER_SITE_OTHER): Payer: Worker's Compensation | Admitting: Family Medicine

## 2017-03-24 ENCOUNTER — Other Ambulatory Visit: Payer: Self-pay

## 2017-03-24 VITALS — BP 108/68 | HR 84 | Temp 98.2°F | Resp 16 | Ht 63.0 in | Wt 158.0 lb

## 2017-03-24 DIAGNOSIS — M25572 Pain in left ankle and joints of left foot: Secondary | ICD-10-CM

## 2017-03-24 DIAGNOSIS — T22232D Burn of second degree of left upper arm, subsequent encounter: Secondary | ICD-10-CM

## 2017-03-24 DIAGNOSIS — T25222D Burn of second degree of left foot, subsequent encounter: Secondary | ICD-10-CM | POA: Diagnosis not present

## 2017-03-24 MED ORDER — HYDROCODONE-ACETAMINOPHEN 5-325 MG PO TABS: 1.0000 | ORAL_TABLET | ORAL | 0 refills | Status: DC | PRN

## 2017-03-24 MED ORDER — IBUPROFEN 600 MG PO TABS: 600.0000 mg | ORAL_TABLET | Freq: Three times a day (TID) | ORAL | 0 refills | Status: DC | PRN
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
## 2017-03-24 NOTE — Patient Instructions (Addendum)
Continue wound care with silvadene or polysporin, keep open wound covered/clean, and recheck in 1 week.  Ok to continue to taper out pain medication as tolerated.  Return to the clinic or go to the nearest emergency room if any of your symptoms worsen or new symptoms occur.   Burn Care, Adult A burn is an injury to the skin or the tissues under the skin. There are three types of burns:  First degree. These burns may cause the skin to be red and slightly swollen.  Second degree. These burns are very painful and cause the skin to be very red. The skin may also leak fluid, look shiny, and develop blisters.  Third degree. These burns cause permanent damage. They either turn the skin white or black and make it look charred, dry, and leathery.  Taking care of your burn properly can help to prevent pain and infection. It can also help the burn to heal more quickly. What are the risks? Complications from burns include:  Damage to the skin.  Reduced blood flow near the injury.  Dead tissue.  Scarring.  Problems with movement, if the burn happened near a joint or on the hands or feet.  Severe burns can lead to problems that affect the whole body, such as:  Fluid loss.  Less blood circulating in the body.  Inability to maintain a normal core body temperature (thermoregulation).  Infection.  Shock.  Problems breathing.  How to care for a first-degree burn Right after a burn:  Rinse or soak the burn under cool water until the pain stops. Do not put ice on your burn. This can cause more damage.  Lightly cover the burn with a sterile cloth (dressing). Burn care  Follow instructions from your health care provider about: ? How to clean and take care of the burn. ? When to change and remove the dressing.  Check your burn every day for signs of infection. Check for: ? More redness, swelling, or pain. ? Warmth. ? Pus or a bad smell. Medicine  Take over-the-counter and  prescription medicines only as told by your health care provider.  If you were prescribed antibiotic medicine, take or apply it as told by your health care provider. Do not stop using the antibiotic even if your condition improves. General instructions  To prevent infection, do not put butter, oil, or other home remedies on your burn.  Do not rub your burn, even when you are cleaning it.  Protect your burn from the sun. How to care for a second-degree burn Right after a burn:  Rinse or soak the burn under cool water. Do this for several minutes. Do not put ice on your burn. This can cause more damage.  Lightly cover the burn with a sterile cloth (dressing). Burn care  Raise (elevate) the injured area above the level of your heart while sitting or lying down.  Follow instructions from your health care provider about: ? How to clean and take care of the burn. ? When to change and remove the dressing.  Check your burn every day for signs of infection. Check for: ? More redness, swelling, or pain. ? Warmth. ? Pus or a bad smell. Medicine   Take over-the-counter and prescription medicines only as told by your health care provider.  If you were prescribed antibiotic medicine, take or apply it as told by your health care provider. Do not stop using the antibiotic even if your condition improves. General instructions  To prevent  infection: ? Do not put butter, oil, or other home remedies on the burn. ? Do not scratch or pick at the burn. ? Do not break any blisters. ? Do not peel skin.  Do not rub your burn, even when you are cleaning it.  Protect your burn from the sun. How to care for a third-degree burn Right after a burn:  Lightly cover the burn with gauze.  Seek immediate medical attention. Burn care  Raise (elevate) the injured area above the level of your heart while sitting or lying down.  Drink enough fluid to keep your urine clear or pale yellow.  Rest as  told by your health care provider. Do not participate in sports or other physical activities until your health care provider approves.  Follow instructions from your health care provider about: ? How to clean and take care of the burn. ? When to change and remove the dressing.  Check your burn every day for signs of infection. Check for: ? More redness, swelling, or pain. ? Warmth. ? Pus or a bad smell. Medicine  Take over-the-counter and prescription medicines only as told by your health care provider.  If you were prescribed antibiotic medicine, take or apply it as told by your health care provider. Do not stop using the antibiotic even if your condition improves. General instructions  To prevent infection: ? Do not put butter, oil, or other home remedies on the burn. ? Do not scratch or pick at the burn. ? Do not break any blisters. ? Do not peel skin. ? Do not rub your burn, even when you are cleaning it.  Protect your burn from the sun.  Keep all follow-up visits as told by your health care provider. This is important. Contact a health care provider if:  Your condition does not improve.  Your condition gets worse.  You have a fever.  Your burn changes in appearance or develops black or red spots.  Your burn feels warm to the touch.  Your pain is not controlled with medicine. Get help right away if:  You have redness, swelling, or pain at the site of the burn.  You have fluid, blood, or pus coming from your burn.  You have red streaks near the burn.  You have severe pain. This information is not intended to replace advice given to you by your health care provider. Make sure you discuss any questions you have with your health care provider. Document Released: 04/05/2005 Document Revised: 10/26/2015 Document Reviewed: 09/23/2015 Elsevier Interactive Patient Education  2018 [employer]     IF you received an x-ray today, you will receive an invoice from  [facility]. Please contact [facility] at [phone] with questions or concerns regarding your invoice.   IF you received labwork today, you will receive an invoice from [facility]. Please contact [facility] at [phone] with questions or concerns regarding your invoice.   Our billing staff will not be able to assist you with questions regarding bills from these companies.  You will be contacted with the lab results as soon as they are available. The fastest way to get your results is to activate your My Chart account. Instructions are located on the last page of this paperwork. If you have not heard from us regarding the results in 2 weeks, please contact this office.

## 2017-03-25 ENCOUNTER — Encounter: Payer: Self-pay | Admitting: Family Medicine

## 2017-03-31 ENCOUNTER — Ambulatory Visit (INDEPENDENT_AMBULATORY_CARE_PROVIDER_SITE_OTHER): Payer: Worker's Compensation | Admitting: Family Medicine

## 2017-03-31 ENCOUNTER — Other Ambulatory Visit: Payer: Self-pay

## 2017-03-31 ENCOUNTER — Encounter: Payer: Self-pay | Admitting: Family Medicine

## 2017-03-31 ENCOUNTER — Telehealth: Payer: Self-pay | Admitting: Family Medicine

## 2017-03-31 VITALS — BP 104/68 | HR 92 | Temp 98.4°F | Resp 18 | Ht 63.0 in | Wt 159.6 lb

## 2017-03-31 DIAGNOSIS — T25222D Burn of second degree of left foot, subsequent encounter: Secondary | ICD-10-CM

## 2017-03-31 DIAGNOSIS — T22292D Burn of second degree of multiple sites of left shoulder and upper limb, except wrist and hand, subsequent encounter: Secondary | ICD-10-CM | POA: Diagnosis not present

## 2017-03-31 DIAGNOSIS — M25572 Pain in left ankle and joints of left foot: Secondary | ICD-10-CM
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
## 2017-03-31 NOTE — Patient Instructions (Addendum)
Keep foot wound clean, covered with polysporin, and bandage - especially when out of the house. Pain medication if needed, follow up in 5 days.   Return to the clinic or go to the nearest emergency room if any of your symptoms worsen or new symptoms occur.   Burn Care, Adult A burn is an injury to the skin or the tissues under the skin. There are three types of burns:  First degree. These burns may cause the skin to be red and slightly swollen.  Second degree. These burns are very painful and cause the skin to be very red. The skin may also leak fluid, look shiny, and develop blisters.  Third degree. These burns cause permanent damage. They either turn the skin white or black and make it look charred, dry, and leathery.  Taking care of your burn properly can help to prevent pain and infection. It can also help the burn to heal more quickly. What are the risks? Complications from burns include:  Damage to the skin.  Reduced blood flow near the injury.  Dead tissue.  Scarring.  Problems with movement, if the burn happened near a joint or on the hands or feet.  Severe burns can lead to problems that affect the whole body, such as:  Fluid loss.  Less blood circulating in the body.  Inability to maintain a normal core body temperature (thermoregulation).  Infection.  Shock.  Problems breathing.  How to care for a first-degree burn Right after a burn:  Rinse or soak the burn under cool water until the pain stops. Do not put ice on your burn. This can cause more damage.  Lightly cover the burn with a sterile cloth (dressing). Burn care  Follow instructions from your health care provider about: ? How to clean and take care of the burn. ? When to change and remove the dressing.  Check your burn every day for signs of infection. Check for: ? More redness, swelling, or pain. ? Warmth. ? Pus or a bad smell. Medicine  Take over-the-counter and prescription medicines  only as told by your health care provider.  If you were prescribed antibiotic medicine, take or apply it as told by your health care provider. Do not stop using the antibiotic even if your condition improves. General instructions  To prevent infection, do not put butter, oil, or other home remedies on your burn.  Do not rub your burn, even when you are cleaning it.  Protect your burn from the sun. How to care for a second-degree burn Right after a burn:  Rinse or soak the burn under cool water. Do this for several minutes. Do not put ice on your burn. This can cause more damage.  Lightly cover the burn with a sterile cloth (dressing). Burn care  Raise (elevate) the injured area above the level of your heart while sitting or lying down.  Follow instructions from your health care provider about: ? How to clean and take care of the burn. ? When to change and remove the dressing.  Check your burn every day for signs of infection. Check for: ? More redness, swelling, or pain. ? Warmth. ? Pus or a bad smell. Medicine   Take over-the-counter and prescription medicines only as told by your health care provider.  If you were prescribed antibiotic medicine, take or apply it as told by your health care provider. Do not stop using the antibiotic even if your condition improves. General instructions  To prevent infection: ?  Do not put butter, oil, or other home remedies on the burn. ? Do not scratch or pick at the burn. ? Do not break any blisters. ? Do not peel skin.  Do not rub your burn, even when you are cleaning it.  Protect your burn from the sun. How to care for a third-degree burn Right after a burn:  Lightly cover the burn with gauze.  Seek immediate medical attention. Burn care  Raise (elevate) the injured area above the level of your heart while sitting or lying down.  Drink enough fluid to keep your urine clear or pale yellow.  Rest as told by your health care  provider. Do not participate in sports or other physical activities until your health care provider approves.  Follow instructions from your health care provider about: ? How to clean and take care of the burn. ? When to change and remove the dressing.  Check your burn every day for signs of infection. Check for: ? More redness, swelling, or pain. ? Warmth. ? Pus or a bad smell. Medicine  Take over-the-counter and prescription medicines only as told by your health care provider.  If you were prescribed antibiotic medicine, take or apply it as told by your health care provider. Do not stop using the antibiotic even if your condition improves. General instructions  To prevent infection: ? Do not put butter, oil, or other home remedies on the burn. ? Do not scratch or pick at the burn. ? Do not break any blisters. ? Do not peel skin. ? Do not rub your burn, even when you are cleaning it.  Protect your burn from the sun.  Keep all follow-up visits as told by your health care provider. This is important. Contact a health care provider if:  Your condition does not improve.  Your condition gets worse.  You have a fever.  Your burn changes in appearance or develops black or red spots.  Your burn feels warm to the touch.  Your pain is not controlled with medicine. Get help right away if:  You have redness, swelling, or pain at the site of the burn.  You have fluid, blood, or pus coming from your burn.  You have red streaks near the burn.  You have severe pain. This information is not intended to replace advice given to you by your health care provider. Make sure you discuss any questions you have with your health care provider. Document Released: 04/05/2005 Document Revised: 10/26/2015 Document Reviewed: 09/23/2015 Elsevier Interactive Patient Education  2018 [employer]    IF you received an x-ray today, you will receive an invoice from [facility]. Please  contact [facility] at [phone] with questions or concerns regarding your invoice.   IF you received labwork today, you will receive an invoice from [facility]. Please contact [facility] at [phone] with questions or concerns regarding your invoice.   Our billing staff will not be able to assist you with questions regarding bills from these companies.  You will be contacted with the lab results as soon as they are available. The fastest way to get your results is to activate your My Chart account. Instructions are located on the last page of this paperwork. If you have not heard from us regarding the results in 2 weeks, please contact this office.

## 2017-03-31 NOTE — Telephone Encounter (Signed)
Copied from CRM [id number]. Topic: General - Other >> Mar 31, 2017  4:12 PM Cecelia ByarsGreen, Temeka L, RMA wrote: Reason for CRM: Patient is requesting a note for working concerning wearing a special pair of shoes for work and that is recommend that she wear them while at work , pt wants to know if she can come by office and pick up letter

## 2017-04-05 ENCOUNTER — Ambulatory Visit: Payer: Self-pay | Admitting: Family Medicine

## 2017-04-07 ENCOUNTER — Ambulatory Visit (INDEPENDENT_AMBULATORY_CARE_PROVIDER_SITE_OTHER): Payer: Worker's Compensation | Admitting: Family Medicine

## 2017-04-07 ENCOUNTER — Encounter: Payer: Self-pay | Admitting: Family Medicine

## 2017-04-07 ENCOUNTER — Other Ambulatory Visit: Payer: Self-pay

## 2017-04-07 VITALS — BP 120/77 | HR 89 | Temp 99.2°F | Resp 18 | Ht 63.0 in | Wt 155.2 lb

## 2017-04-07 DIAGNOSIS — T25222D Burn of second degree of left foot, subsequent encounter: Secondary | ICD-10-CM

## 2017-04-07 DIAGNOSIS — T2220XD Burn of second degree of shoulder and upper limb, except wrist and hand, unspecified site, subsequent encounter: Secondary | ICD-10-CM

## 2017-04-07 DIAGNOSIS — M25572 Pain in left ankle and joints of left foot: Secondary | ICD-10-CM

## 2017-04-07 MED ORDER — GABAPENTIN 100 MG PO CAPS: 100.0000 mg | ORAL_CAPSULE | Freq: Three times a day (TID) | ORAL | 0 refills | Status: DC
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
## 2017-04-07 NOTE — Patient Instructions (Addendum)
Your foot looks better. You can try gabapentin up to 3 times per day, but start at once per night, then increase by one dose every few days if needed. Ok to use other meds if needed.  Keep wound on foot clean/covered at work.   seated rest 10 minutes per hour if needed at this point.    Recheck January 3rd.    IF you received an x-ray today, you will receive an invoice from [facility]. Please contact [facility] at [phone] with questions or concerns regarding your invoice.   IF you received labwork today, you will receive an invoice from [facility]. Please contact [facility] at [phone] with questions or concerns regarding your invoice.   Our billing staff will not be able to assist you with questions regarding bills from these companies.  You will be contacted with the lab results as soon as they are available. The fastest way to get your results is to activate your My Chart account. Instructions are located on the last page of this paperwork. If you have not heard from us regarding the results in 2 weeks, please contact this office.

## 2017-04-21 ENCOUNTER — Encounter: Payer: Self-pay | Admitting: Urgent Care

## 2017-04-21 ENCOUNTER — Ambulatory Visit (INDEPENDENT_AMBULATORY_CARE_PROVIDER_SITE_OTHER): Payer: Worker's Compensation | Admitting: Urgent Care

## 2017-04-21 ENCOUNTER — Ambulatory Visit: Payer: Self-pay | Admitting: Urgent Care

## 2017-04-21 VITALS — BP 112/72 | HR 79 | Temp 98.4°F | Resp 16 | Ht 63.0 in | Wt 154.4 lb

## 2017-04-21 DIAGNOSIS — M25572 Pain in left ankle and joints of left foot: Secondary | ICD-10-CM | POA: Diagnosis not present

## 2017-04-21 DIAGNOSIS — T2220XD Burn of second degree of shoulder and upper limb, except wrist and hand, unspecified site, subsequent encounter: Secondary | ICD-10-CM | POA: Diagnosis not present

## 2017-04-21 DIAGNOSIS — T25222D Burn of second degree of left foot, subsequent encounter: Secondary | ICD-10-CM

## 2017-04-21 NOTE — Patient Instructions (Addendum)
Burn Care, Adult  A burn is an injury to the skin or the tissues under the skin. There are three types of burns:  · First degree. These burns may cause the skin to be red and slightly swollen.  · Second degree. These burns are very painful and cause the skin to be very red. The skin may also leak fluid, look shiny, and develop blisters.  · Third degree. These burns cause permanent damage. They either turn the skin white or black and make it look charred, dry, and leathery.    Taking care of your burn properly can help to prevent pain and infection. It can also help the burn to heal more quickly.  What are the risks?  Complications from burns include:  · Damage to the skin.  · Reduced blood flow near the injury.  · Dead tissue.  · Scarring.  · Problems with movement, if the burn happened near a joint or on the hands or feet.    Severe burns can lead to problems that affect the whole body, such as:  · Fluid loss.  · Less blood circulating in the body.  · Inability to maintain a normal core body temperature (thermoregulation).  · Infection.  · Shock.  · Problems breathing.    How to care for a first-degree burn  Right after a burn:  · Rinse or soak the burn under cool water until the pain stops. Do not put ice on your burn. This can cause more damage.  · Lightly cover the burn with a sterile cloth (dressing).  Burn care  · Follow instructions from your health care provider about:  ? How to clean and take care of the burn.  ? When to change and remove the dressing.  · Check your burn every day for signs of infection. Check for:  ? More redness, swelling, or pain.  ? Warmth.  ? Pus or a bad smell.  Medicine  · Take over-the-counter and prescription medicines only as told by your health care provider.  · If you were prescribed antibiotic medicine, take or apply it as told by your health care provider. Do not stop using the antibiotic even if your condition improves.  General instructions  · To prevent infection, do not  put butter, oil, or other home remedies on your burn.  · Do not rub your burn, even when you are cleaning it.  · Protect your burn from the sun.  How to care for a second-degree burn  Right after a burn:  · Rinse or soak the burn under cool water. Do this for several minutes. Do not put ice on your burn. This can cause more damage.  · Lightly cover the burn with a sterile cloth (dressing).  Burn care  · Raise (elevate) the injured area above the level of your heart while sitting or lying down.  · Follow instructions from your health care provider about:  ? How to clean and take care of the burn.  ? When to change and remove the dressing.  · Check your burn every day for signs of infection. Check for:  ? More redness, swelling, or pain.  ? Warmth.  ? Pus or a bad smell.  Medicine    · Take over-the-counter and prescription medicines only as told by your health care provider.  · If you were prescribed antibiotic medicine, take or apply it as told by your health care provider. Do not stop using the antibiotic even if your   the sun. How to care for a third-degree burn Right after a burn:  Lightly cover the burn with gauze.  Seek immediate medical attention. Burn care  Raise (elevate) the injured area above the level of your heart while sitting or lying down.  Drink enough fluid to keep your urine clear or pale yellow.  Rest as told by your health care provider. Do not participate in sports or other physical activities until your health care provider approves.  Follow instructions from your health care provider about: ? How to clean and take care of the burn. ? When to change and remove the dressing.  Check  your burn every day for signs of infection. Check for: ? More redness, swelling, or pain. ? Warmth. ? Pus or a bad smell. Medicine  Take over-the-counter and prescription medicines only as told by your health care provider.  If you were prescribed antibiotic medicine, take or apply it as told by your health care provider. Do not stop using the antibiotic even if your condition improves. General instructions  To prevent infection: ? Do not put butter, oil, or other home remedies on the burn. ? Do not scratch or pick at the burn. ? Do not break any blisters. ? Do not peel skin. ? Do not rub your burn, even when you are cleaning it.  Protect your burn from the sun.  Keep all follow-up visits as told by your health care provider. This is important. Contact a health care provider if:  Your condition does not improve.  Your condition gets worse.  You have a fever.  Your burn changes in appearance or develops black or red spots.  Your burn feels warm to the touch.  Your pain is not controlled with medicine. Get help right away if:  You have redness, swelling, or pain at the site of the burn.  You have fluid, blood, or pus coming from your burn.  You have red streaks near the burn.  You have severe pain. This information is not intended to replace advice given to you by your health care provider. Make sure you discuss any questions you have with your health care provider. Document Released: 04/05/2005 Document Revised: 10/26/2015 Document Reviewed: 09/23/2015 Elsevier Interactive Patient Education  2018 Elsevier Inc.     IF you received an x-ray today, you will receive an invoice from [facility]. Please contact [facility] at [phone] with questions or concerns regarding your invoice.   IF you received labwork today, you will receive an invoice from [facility]. Please contact [facility] at [phone] with questions or concerns regarding your invoice.    Our billing staff will not be able to assist you with questions regarding bills from these companies.  You will be contacted with the lab results as soon as they are available. The fastest way to get your results is to activate your My Chart account. Instructions are located on the last page of this paperwork. If you have not heard from us regarding the results in 2 weeks, please contact this office.      
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
## 2017-04-24 LAB — WOUND CULTURE: Organism ID, Bacteria: NONE SEEN

## 2017-09-06 ENCOUNTER — Ambulatory Visit: Payer: Managed Care, Other (non HMO) | Admitting: Primary Care

## 2017-09-06 ENCOUNTER — Encounter: Payer: Self-pay | Admitting: Primary Care

## 2017-09-06 DIAGNOSIS — R5382 Chronic fatigue, unspecified: Secondary | ICD-10-CM | POA: Diagnosis not present

## 2017-09-06 DIAGNOSIS — E663 Overweight: Secondary | ICD-10-CM | POA: Diagnosis not present

## 2017-09-06 DIAGNOSIS — R5383 Other fatigue: Secondary | ICD-10-CM | POA: Insufficient documentation

## 2017-09-06 LAB — COMPREHENSIVE METABOLIC PANEL
ALT: 17 U/L (ref 0–35)
AST: 16 U/L (ref 0–37)
Albumin: 4.3 g/dL (ref 3.5–5.2)
Alkaline Phosphatase: 47 U/L (ref 39–117)
BUN: 9 mg/dL (ref 6–23)
CO2: 26 mEq/L (ref 19–32)
Calcium: 9.4 mg/dL (ref 8.4–10.5)
Chloride: 105 mEq/L (ref 96–112)
Creatinine, Ser: 0.75 mg/dL (ref 0.40–1.20)
GFR: 90.72 mL/min (ref >60.00)
GLUCOSE: 82 mg/dL (ref 70–99)
Potassium: 4.1 mEq/L (ref 3.5–5.1)
Sodium: 139 mEq/L (ref 135–145)
Total Bilirubin: 0.6 mg/dL (ref 0.2–1.2)
Total Protein: 7.5 g/dL (ref 6.0–8.3)

## 2017-09-06 LAB — LIPID PANEL
Cholesterol: 213 mg/dL — ABNORMAL HIGH (ref 0–200)
HDL: 63.9 mg/dL (ref >39.00)
LDL Cholesterol: 135 mg/dL — ABNORMAL HIGH (ref 0–99)
NONHDL: 148.97
Total CHOL/HDL Ratio: 3
Triglycerides: 71 mg/dL (ref 0.0–149.0)
VLDL: 14.2 mg/dL (ref 0.0–40.0)

## 2017-09-06 LAB — VITAMIN B12: VITAMIN B 12: 727 pg/mL (ref 211–911)

## 2017-09-06 LAB — CBC
HCT: 41.1 % (ref 36.0–46.0)
Hemoglobin: 14 g/dL (ref 12.0–15.0)
MCHC: 34.1 g/dL (ref 30.0–36.0)
MCV: 88.3 fl (ref 78.0–100.0)
Platelets: 315 10*3/uL (ref 150.0–400.0)
RBC: 4.66 Mil/uL (ref 3.87–5.11)
RDW: 13.9 % (ref 11.5–15.5)
WBC: 5.3 10*3/uL (ref 4.0–10.5)

## 2017-09-06 LAB — VITAMIN D 25 HYDROXY (VIT D DEFICIENCY, FRACTURES): VITD: 30.01 ng/mL (ref 30.00–100.00)

## 2017-09-06 LAB — TSH: TSH: 0.44 u[IU]/mL (ref 0.35–4.50)

## 2017-09-06 NOTE — Patient Instructions (Signed)
Stop by the lab prior to leaving today. I will notify you of your results once received.   Start exercising. You should be getting 150 minutes of moderate intensity exercise weekly.  Limit sweets and junk food in your diet. Increase vegetables, fruit, whole grains, lean protein. Limit portion sizes. Consider calorie counting.   Ensure you are consuming 64 ounces of water daily.  It was a pleasure to meet you today! Please don't hesitate to call or message me with any questions. Welcome to [facility]!

## 2017-09-06 NOTE — Assessment & Plan Note (Signed)
Over the last 6 months. Low likelihood of sleep apnea based off of HPI. Check labs today including CBC, TSH, CMP, Vitamin D and B 12. Recommended to resume regular exercise and limit junk food.
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
## 2017-09-06 NOTE — Assessment & Plan Note (Signed)
Weight gain of 11 pounds since December 2018. Check TSH today. Recommended to resume regular exercise, work on diet, start calorie counting.

## 2017-09-27 ENCOUNTER — Encounter: Payer: Self-pay | Admitting: Primary Care

## 2017-09-27 ENCOUNTER — Other Ambulatory Visit (HOSPITAL_COMMUNITY)
Admission: RE | Admit: 2017-09-27 | Discharge: 2017-09-27 | Disposition: A | Discharge status: Home / Self Care | Payer: Managed Care, Other (non HMO) | Source: Ambulatory Visit | Attending: Primary Care | Admitting: Primary Care

## 2017-09-27 ENCOUNTER — Ambulatory Visit (INDEPENDENT_AMBULATORY_CARE_PROVIDER_SITE_OTHER): Payer: Managed Care, Other (non HMO) | Admitting: Primary Care

## 2017-09-27 VITALS — BP 120/76 | HR 82 | Temp 98.2°F | Ht 63.5 in | Wt 166.8 lb

## 2017-09-27 DIAGNOSIS — E785 Hyperlipidemia, unspecified: Secondary | ICD-10-CM

## 2017-09-27 DIAGNOSIS — R87810 Cervical high risk human papillomavirus (HPV) DNA test positive: Secondary | ICD-10-CM | POA: Insufficient documentation

## 2017-09-27 DIAGNOSIS — R5382 Chronic fatigue, unspecified: Secondary | ICD-10-CM

## 2017-09-27 DIAGNOSIS — Z124 Encounter for screening for malignant neoplasm of cervix: Secondary | ICD-10-CM | POA: Insufficient documentation

## 2017-09-27 DIAGNOSIS — Z1151 Encounter for screening for human papillomavirus (HPV): Secondary | ICD-10-CM | POA: Insufficient documentation

## 2017-09-27 DIAGNOSIS — Z0000 Encounter for general adult medical examination without abnormal findings: Secondary | ICD-10-CM | POA: Diagnosis not present

## 2017-09-27 DIAGNOSIS — Z1231 Encounter for screening mammogram for malignant neoplasm of breast: Secondary | ICD-10-CM

## 2017-09-27 DIAGNOSIS — R87610 Atypical squamous cells of undetermined significance on cytologic smear of cervix (ASC-US): Secondary | ICD-10-CM | POA: Insufficient documentation

## 2017-09-27 DIAGNOSIS — Z1239 Encounter for other screening for malignant neoplasm of breast: Secondary | ICD-10-CM

## 2017-09-27 NOTE — Assessment & Plan Note (Signed)
Immunizations UTD. Pap smear due, pending. Mammogram due, ordered. Commended her on getting back in to exercise, recommended she continue. Encouraged a healthy diet, commended her in reduction of sweets. Exam unremarkable. Labs overall unremarkable, slight elevation in cholesterol. Follow up in 1 year for CPE.

## 2017-09-27 NOTE — Patient Instructions (Signed)
Call the [facility] to schedule your mammogram.  We will be in touch once we receive your pap smear results.  Continue to work on cutting back on sweets.  Continue to work on exercise.  Ensure you are consuming 64 ounces of water daily.  Follow up in 1 year for your annual exam or sooner if needed.  It was a pleasure to see you today!   Preventive Care 40-64 Years, Female Preventive care refers to lifestyle choices and visits with your health care provider that can promote health and wellness. What does preventive care include?  A yearly physical exam. This is also called an annual well check.  Dental exams once or twice a year.  Routine eye exams. Ask your health care provider how often you should have your eyes checked.  Personal lifestyle choices, including: ? Daily care of your teeth and gums. ? Regular physical activity. ? Eating a healthy diet. ? Avoiding tobacco and drug use. ? Limiting alcohol use. ? Practicing safe sex. ? Taking low-dose aspirin daily starting at age 90. ? Taking vitamin and mineral supplements as recommended by your health care provider. What happens during an annual well check? The services and screenings done by your health care provider during your annual well check will depend on your age, overall health, lifestyle risk factors, and family history of disease. Counseling Your health care provider may ask you questions about your:  Alcohol use.  Tobacco use.  Drug use.  Emotional well-being.  Home and relationship well-being.  Sexual activity.  Eating habits.  Work and work Statistician.  Method of birth control.  Menstrual cycle.  Pregnancy history.  Screening You may have the following tests or measurements:  Height, weight, and BMI.  Blood pressure.  Lipid and cholesterol levels. These may be checked every 5 years, or more frequently if you are over 70 years old.  Skin check.  Lung cancer screening.  You may have this screening every year starting at age 39 if you have a 30-pack-year history of smoking and currently smoke or have quit within the past 15 years.  Fecal occult blood test (FOBT) of the stool. You may have this test every year starting at age 52.  Flexible sigmoidoscopy or colonoscopy. You may have a sigmoidoscopy every 5 years or a colonoscopy every 10 years starting at age 26.  Hepatitis C blood test.  Hepatitis B blood test.  Sexually transmitted disease (STD) testing.  Diabetes screening. This is done by checking your blood sugar (glucose) after you have not eaten for a while (fasting). You may have this done every 1-3 years.  Mammogram. This may be done every 1-2 years. Talk to your health care provider about when you should start having regular mammograms. This may depend on whether you have a family history of breast cancer.  BRCA-related cancer screening. This may be done if you have a family history of breast, ovarian, tubal, or peritoneal cancers.  Pelvic exam and Pap test. This may be done every 3 years starting at age 20. Starting at age 82, this may be done every 5 years if you have a Pap test in combination with an HPV test.  Bone density scan. This is done to screen for osteoporosis. You may have this scan if you are at high risk for osteoporosis.  Discuss your test results, treatment options, and if necessary, the need for more tests with your health care provider. Vaccines Your health care provider may recommend certain vaccines,  such as:  Influenza vaccine. This is recommended every year.  Tetanus, diphtheria, and acellular pertussis (Tdap, Td) vaccine. You may need a Td booster every 10 years.  Varicella vaccine. You may need this if you have not been vaccinated.  Zoster vaccine. You may need this after age 46.  Measles, mumps, and rubella (MMR) vaccine. You may need at least one dose of MMR if you were born in 1957 or later. You may also need a  second dose.  Pneumococcal 13-valent conjugate (PCV13) vaccine. You may need this if you have certain conditions and were not previously vaccinated.  Pneumococcal polysaccharide (PPSV23) vaccine. You may need one or two doses if you smoke cigarettes or if you have certain conditions.  Meningococcal vaccine. You may need this if you have certain conditions.  Hepatitis A vaccine. You may need this if you have certain conditions or if you travel or work in places where you may be exposed to hepatitis A.  Hepatitis B vaccine. You may need this if you have certain conditions or if you travel or work in places where you may be exposed to hepatitis B.  Haemophilus influenzae type b (Hib) vaccine. You may need this if you have certain conditions.  Talk to your health care provider about which screenings and vaccines you need and how often you need them. This information is not intended to replace advice given to you by your health care provider. Make sure you discuss any questions you have with your health care provider. Document Released: 05/02/2015 Document Revised: 12/24/2015 Document Reviewed: 02/04/2015 Elsevier Interactive Patient Education  [employer].
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
## 2017-09-27 NOTE — Assessment & Plan Note (Signed)
LDL slightly above goal on labs from late May 2019. Continue to work on diet and exercise. Continue to monitor.

## 2017-09-27 NOTE — Assessment & Plan Note (Addendum)
Improved. Could be secondary to vitamin D supplements, re-initiation of exercise, and/or slight improvement in diet.   Encouraged continued improvement in diet and regular exercise.

## 2017-09-27 NOTE — Addendum Note (Signed)
Addended by: Tawnya CrookSAMBATH, Felipe Paluch on: 09/27/2017 09:49 AM   Modules accepted: Orders

## 2017-09-29 LAB — CYTOLOGY - PAP
ADEQUACY: ABSENT — AB
DIAGNOSIS: UNDETERMINED — AB
HPV (WINDOPATH): DETECTED — AB
HPV 16/18/45 GENOTYPING: NEGATIVE

## 2017-11-11 ENCOUNTER — Other Ambulatory Visit: Payer: Self-pay | Admitting: Primary Care

## 2017-11-11 DIAGNOSIS — Z1231 Encounter for screening mammogram for malignant neoplasm of breast: Secondary | ICD-10-CM

## 2018-02-16 ENCOUNTER — Ambulatory Visit: Payer: Managed Care, Other (non HMO) | Admitting: Family Medicine

## 2018-02-16 ENCOUNTER — Encounter: Payer: Self-pay | Admitting: Family Medicine

## 2018-02-16 VITALS — BP 108/70 | HR 73 | Temp 98.8°F | Ht 63.5 in | Wt 170.5 lb

## 2018-02-16 DIAGNOSIS — J029 Acute pharyngitis, unspecified: Secondary | ICD-10-CM | POA: Diagnosis not present

## 2018-02-16 LAB — CBC WITH DIFFERENTIAL/PLATELET
BASOS ABS: 0.1 10*3/uL (ref 0.0–0.1)
Basophils Relative: 0.6 % (ref 0.0–3.0)
EOS PCT: 2 % (ref 0.0–5.0)
Eosinophils Absolute: 0.2 10*3/uL (ref 0.0–0.7)
HEMATOCRIT: 39.2 % (ref 36.0–46.0)
HEMOGLOBIN: 13.2 g/dL (ref 12.0–15.0)
LYMPHS PCT: 20.7 % (ref 12.0–46.0)
Lymphs Abs: 1.9 10*3/uL (ref 0.7–4.0)
MCHC: 33.8 g/dL (ref 30.0–36.0)
MCV: 89.5 fl (ref 78.0–100.0)
MONOS PCT: 6.2 % (ref 3.0–12.0)
Monocytes Absolute: 0.6 10*3/uL (ref 0.1–1.0)
Neutro Abs: 6.5 10*3/uL (ref 1.4–7.7)
Neutrophils Relative %: 70.5 % (ref 43.0–77.0)
Platelets: 348 10*3/uL (ref 150.0–400.0)
RBC: 4.38 Mil/uL (ref 3.87–5.11)
RDW: 13.3 % (ref 11.5–15.5)
WBC: 9.3 10*3/uL (ref 4.0–10.5)

## 2018-02-16 LAB — MONONUCLEOSIS SCREEN: Mono Screen: NEGATIVE

## 2018-02-16 LAB — POCT RAPID STREP A (OFFICE): RAPID STREP A SCREEN: NEGATIVE

## 2018-02-16 MED ORDER — AMOXICILLIN 500 MG PO CAPS: 500.0000 mg | ORAL_CAPSULE | Freq: Three times a day (TID) | ORAL | 21 refills | Status: DC

## 2018-02-16 NOTE — Patient Instructions (Addendum)
Drink lots of fluids Continue ibuprofen as directed (with food)   Stop the flovent inhaler   Tessalon is ok for cough   Salt water gargle Chloraseptic throat spray  Throat culture pending  Labs as well   If symptoms suddenly worsen -fill px for amoxicillin and take it
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
## 2018-02-16 NOTE — Assessment & Plan Note (Signed)
For almost 3 weeks after uri  Now just the ST rst neg  Throat cx sent Cbc and mono test (doubt mono due to lack of adenopathy) Disc symptomatic care - see instructions on AVS  Given px for amox to hold -if symptoms suddenly worsen over weekend will fill and start  Update if not starting to improve in a week or if worsening

## 2018-02-18 LAB — CULTURE, GROUP A STREP
MICRO NUMBER:: 91312134
SPECIMEN QUALITY: ADEQUATE

## 2018-02-20 ENCOUNTER — Telehealth: Payer: Self-pay | Admitting: *Deleted

## 2018-02-20 NOTE — Telephone Encounter (Signed)
Lm on pts vm requesting a call back regarding results.  

## 2018-09-05 ENCOUNTER — Encounter: Payer: Self-pay | Admitting: Primary Care

## 2018-09-05 ENCOUNTER — Ambulatory Visit: Payer: Managed Care, Other (non HMO) | Admitting: Primary Care

## 2018-09-05 ENCOUNTER — Other Ambulatory Visit (HOSPITAL_COMMUNITY)
Admission: RE | Admit: 2018-09-05 | Discharge: 2018-09-05 | Disposition: A | Discharge status: Home / Self Care | Payer: Managed Care, Other (non HMO) | Source: Ambulatory Visit | Attending: Primary Care | Admitting: Primary Care

## 2018-09-05 ENCOUNTER — Other Ambulatory Visit: Payer: Self-pay

## 2018-09-05 VITALS — BP 118/78 | HR 71 | Temp 98.5°F | Ht 63.5 in | Wt 164.2 lb

## 2018-09-05 DIAGNOSIS — J3489 Other specified disorders of nose and nasal sinuses: Secondary | ICD-10-CM

## 2018-09-05 DIAGNOSIS — Z8742 Personal history of other diseases of the female genital tract: Secondary | ICD-10-CM | POA: Insufficient documentation

## 2018-09-05 DIAGNOSIS — K644 Residual hemorrhoidal skin tags: Secondary | ICD-10-CM | POA: Diagnosis not present

## 2018-09-05 DIAGNOSIS — N898 Other specified noninflammatory disorders of vagina: Secondary | ICD-10-CM | POA: Diagnosis not present

## 2018-09-05 HISTORY — DX: Other specified noninflammatory disorders of vagina: N89.8

## 2018-09-05 HISTORY — DX: Other specified disorders of nose and nasal sinuses: J34.89

## 2018-09-05 LAB — POC URINALSYSI DIPSTICK (AUTOMATED)
Bilirubin, UA: NEGATIVE
Blood, UA: NEGATIVE
Glucose, UA: NEGATIVE
Ketones, UA: NEGATIVE
Leukocytes, UA: NEGATIVE
Nitrite, UA: NEGATIVE
Protein, UA: NEGATIVE
Spec Grav, UA: 1.025 (ref 1.010–1.025)
Urobilinogen, UA: 0.2 E.U./dL
pH, UA: 6 (ref 5.0–8.0)

## 2018-09-05 NOTE — Assessment & Plan Note (Signed)
Mildly present on exam. Wet prep completed and is pending. UA negative.
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
## 2018-09-05 NOTE — Patient Instructions (Addendum)
I will be in touch once I receive your pap smear and vaginal swab results.  It was a pleasure to see you today!

## 2018-09-05 NOTE — Assessment & Plan Note (Signed)
Questionable.  Referral placed to dermatology.

## 2018-09-05 NOTE — Assessment & Plan Note (Signed)
ASCUS with negative HPV in 2019. Repeat Pap smear pending.

## 2018-09-05 NOTE — Assessment & Plan Note (Signed)
Non thrombosed. Referral placed to GI for evaluation.

## 2018-09-06 ENCOUNTER — Other Ambulatory Visit: Payer: Self-pay | Admitting: Primary Care

## 2018-09-06 DIAGNOSIS — K644 Residual hemorrhoidal skin tags: Secondary | ICD-10-CM

## 2018-09-06 DIAGNOSIS — N760 Acute vaginitis: Secondary | ICD-10-CM

## 2018-09-06 DIAGNOSIS — B9689 Other specified bacterial agents as the cause of diseases classified elsewhere: Secondary | ICD-10-CM

## 2018-09-06 LAB — WET PREP BY MOLECULAR PROBE
Candida species: NOT DETECTED
MICRO NUMBER:: 487579
SPECIMEN QUALITY:: ADEQUATE
Trichomonas vaginosis: NOT DETECTED

## 2018-09-06 MED ORDER — METRONIDAZOLE 500 MG PO TABS: 500.0000 mg | ORAL_TABLET | Freq: Two times a day (BID) | ORAL | 0 refills | Status: DC

## 2018-09-06 NOTE — Assessment & Plan Note (Signed)
Spoke with GI who suspects more anal skin tag. General surgery will need to remove. New referral placed.

## 2018-09-07 LAB — CYTOLOGY - PAP
Diagnosis: NEGATIVE
Diagnosis: REACTIVE
HPV: NOT DETECTED

## 2018-09-29 ENCOUNTER — Encounter: Payer: Self-pay | Admitting: Primary Care

## 2018-09-29 ENCOUNTER — Ambulatory Visit (INDEPENDENT_AMBULATORY_CARE_PROVIDER_SITE_OTHER): Payer: Managed Care, Other (non HMO) | Admitting: Primary Care

## 2018-09-29 ENCOUNTER — Other Ambulatory Visit: Payer: Self-pay

## 2018-09-29 VITALS — BP 114/72 | HR 89 | Temp 98.2°F | Ht 63.5 in | Wt 161.5 lb

## 2018-09-29 DIAGNOSIS — E785 Hyperlipidemia, unspecified: Secondary | ICD-10-CM

## 2018-09-29 DIAGNOSIS — Z0000 Encounter for general adult medical examination without abnormal findings: Secondary | ICD-10-CM | POA: Diagnosis not present

## 2018-09-29 DIAGNOSIS — K644 Residual hemorrhoidal skin tags: Secondary | ICD-10-CM | POA: Diagnosis not present

## 2018-09-29 DIAGNOSIS — Z1239 Encounter for other screening for malignant neoplasm of breast: Secondary | ICD-10-CM | POA: Diagnosis not present

## 2018-09-29 DIAGNOSIS — F411 Generalized anxiety disorder: Secondary | ICD-10-CM

## 2018-09-29 LAB — COMPREHENSIVE METABOLIC PANEL
ALT: 15 U/L (ref 0–35)
AST: 14 U/L (ref 0–37)
Albumin: 4.3 g/dL (ref 3.5–5.2)
Alkaline Phosphatase: 42 U/L (ref 39–117)
BUN: 15 mg/dL (ref 6–23)
CO2: 26 mEq/L (ref 19–32)
Calcium: 9.4 mg/dL (ref 8.4–10.5)
Chloride: 106 mEq/L (ref 96–112)
Creatinine, Ser: 0.86 mg/dL (ref 0.40–1.20)
GFR: 72.5 mL/min (ref >60.00)
Glucose, Bld: 85 mg/dL (ref 70–99)
Potassium: 5 mEq/L (ref 3.5–5.1)
Sodium: 140 mEq/L (ref 135–145)
Total Bilirubin: 0.4 mg/dL (ref 0.2–1.2)
Total Protein: 6.9 g/dL (ref 6.0–8.3)

## 2018-09-29 LAB — LIPID PANEL
Cholesterol: 216 mg/dL — ABNORMAL HIGH (ref 0–200)
HDL: 57.1 mg/dL (ref >39.00)
LDL Cholesterol: 147 mg/dL — ABNORMAL HIGH (ref 0–99)
NonHDL: 159.33
Total CHOL/HDL Ratio: 4
Triglycerides: 62 mg/dL (ref 0.0–149.0)
VLDL: 12.4 mg/dL (ref 0.0–40.0)

## 2018-09-29 MED ORDER — CITALOPRAM HYDROBROMIDE 20 MG PO TABS: 20.0000 mg | ORAL_TABLET | Freq: Every day | ORAL | 0 refills | Status: DC

## 2018-09-29 NOTE — Assessment & Plan Note (Addendum)
Following with GI, will work on increasing fiber and water to reduce internal hemorrhoids. No surgery scheduled for now.
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
## 2018-09-29 NOTE — Assessment & Plan Note (Signed)
Chronic, intermittent for years. Has been off citalopram for 3-4 years. Agree that she should resume. GAD 7 score of 15 today. Rx for citalopram 20 mg sent to pharmacy. She will update in 4-6 weeks.

## 2018-09-29 NOTE — Patient Instructions (Addendum)
Stop by the lab prior to leaving today. I will notify you of your results once received.   [facility].  Start exercising. You should be getting 150 minutes of moderate intensity exercise weekly.  Continue to work on a healthy diet.  Ensure you are consuming 64 ounces of water daily.  Start citalopram (Celxa) 20 mg tablets for anxiety. Start with 1/2 tablet daily for 8 days, then increase to 1 full tablet thereafter.  Please update me in 4-6 weeks as discussed.  It was a pleasure to see you today!   Preventive Care 40-64 Years, Female Preventive care refers to lifestyle choices and visits with your health care provider that can promote health and wellness. What does preventive care include?   A yearly physical exam. This is also called an annual well check.  Dental exams once or twice a year.  Routine eye exams. Ask your health care provider how often you should have your eyes checked.  Personal lifestyle choices, including: ? Daily care of your teeth and gums. ? Regular physical activity. ? Eating a healthy diet. ? Avoiding tobacco and drug use. ? Limiting alcohol use. ? Practicing safe sex. ? Taking low-dose aspirin daily starting at age 77. ? Taking vitamin and mineral supplements as recommended by your health care provider. What happens during an annual well check? The services and screenings done by your health care provider during your annual well check will depend on your age, overall health, lifestyle risk factors, and family history of disease. Counseling Your health care provider may ask you questions about your:  Alcohol use.  Tobacco use.  Drug use.  Emotional well-being.  Home and relationship well-being.  Sexual activity.  Eating habits.  Work and work Statistician.  Method of birth control.  Menstrual cycle.  Pregnancy history. Screening You may have the following tests or measurements:  Height,  weight, and BMI.  Blood pressure.  Lipid and cholesterol levels. These may be checked every 5 years, or more frequently if you are over 55 years old.  Skin check.  Lung cancer screening. You may have this screening every year starting at age 51 if you have a 30-pack-year history of smoking and currently smoke or have quit within the past 15 years.  Colorectal cancer screening. All adults should have this screening starting at age 26 and continuing until age 68. Your health care provider may recommend screening at age 47. You will have tests every 1-10 years, depending on your results and the type of screening test. People at increased risk should start screening at an earlier age. Screening tests may include: ? Guaiac-based fecal occult blood testing. ? Fecal immunochemical test (FIT). ? Stool DNA test. ? Virtual colonoscopy. ? Sigmoidoscopy. During this test, a flexible tube with a tiny camera (sigmoidoscope) is used to examine your rectum and lower colon. The sigmoidoscope is inserted through your anus into your rectum and lower colon. ? Colonoscopy. During this test, a long, thin, flexible tube with a tiny camera (colonoscope) is used to examine your entire colon and rectum.  Hepatitis C blood test.  Hepatitis B blood test.  Sexually transmitted disease (STD) testing.  Diabetes screening. This is done by checking your blood sugar (glucose) after you have not eaten for a while (fasting). You may have this done every 1-3 years.  Mammogram. This may be done every 1-2 years. Talk to your health care provider about when you should start having regular mammograms. This  may depend on whether you have a family history of breast cancer.  BRCA-related cancer screening. This may be done if you have a family history of breast, ovarian, tubal, or peritoneal cancers.  Pelvic exam and Pap test. This may be done every 3 years starting at age 72. Starting at age 59, this may be done every 5 years if  you have a Pap test in combination with an HPV test.  Bone density scan. This is done to screen for osteoporosis. You may have this scan if you are at high risk for osteoporosis. Discuss your test results, treatment options, and if necessary, the need for more tests with your health care provider. Vaccines Your health care provider may recommend certain vaccines, such as:  Influenza vaccine. This is recommended every year.  Tetanus, diphtheria, and acellular pertussis (Tdap, Td) vaccine. You may need a Td booster every 10 years.  Varicella vaccine. You may need this if you have not been vaccinated.  Zoster vaccine. You may need this after age 76.  Measles, mumps, and rubella (MMR) vaccine. You may need at least one dose of MMR if you were born in 1957 or later. You may also need a second dose.  Pneumococcal 13-valent conjugate (PCV13) vaccine. You may need this if you have certain conditions and were not previously vaccinated.  Pneumococcal polysaccharide (PPSV23) vaccine. You may need one or two doses if you smoke cigarettes or if you have certain conditions.  Meningococcal vaccine. You may need this if you have certain conditions.  Hepatitis A vaccine. You may need this if you have certain conditions or if you travel or work in places where you may be exposed to hepatitis A.  Hepatitis B vaccine. You may need this if you have certain conditions or if you travel or work in places where you may be exposed to hepatitis B.  Haemophilus influenzae type b (Hib) vaccine. You may need this if you have certain conditions. Talk to your health care provider about which screenings and vaccines you need and how often you need them. This information is not intended to replace advice given to you by your health care provider. Make sure you discuss any questions you have with your health care provider. Document Released: 05/02/2015 Document Revised: 05/26/2017 Document Reviewed: 02/04/2015 Elsevier  Interactive Patient Education  2019 [employer].

## 2018-09-29 NOTE — Assessment & Plan Note (Signed)
Repeat lipid panel pending.  Encouraged a healthy diet and regular exercise. 

## 2018-09-29 NOTE — Assessment & Plan Note (Signed)
Immunizations UTD. Pap smear UTD, due in 3 years. Mammogram overdue, order placed. Encouraged a healthy diet and regular exercise. Exam unremarkable. Labs pending. Follow up in 1 year for CPE.

## 2018-10-06 ENCOUNTER — Encounter: Payer: Self-pay | Admitting: Primary Care

## 2018-12-20 ENCOUNTER — Other Ambulatory Visit: Payer: Self-pay | Admitting: Primary Care

## 2018-12-20 DIAGNOSIS — F411 Generalized anxiety disorder: Secondary | ICD-10-CM

## 2019-01-26 ENCOUNTER — Encounter: Payer: Self-pay | Admitting: Family Medicine

## 2019-01-26 ENCOUNTER — Other Ambulatory Visit: Payer: Self-pay

## 2019-01-26 ENCOUNTER — Ambulatory Visit: Payer: Managed Care, Other (non HMO) | Admitting: Family Medicine

## 2019-01-26 VITALS — BP 118/66 | HR 70 | Temp 98.3°F | Ht 63.5 in | Wt 159.4 lb

## 2019-01-26 DIAGNOSIS — N898 Other specified noninflammatory disorders of vagina: Secondary | ICD-10-CM | POA: Diagnosis not present

## 2019-01-26 LAB — POCT WET PREP (WET MOUNT): Trichomonas Wet Prep HPF POC: ABSENT

## 2019-01-26 MED ORDER — METRONIDAZOLE 500 MG PO TABS: 500.0000 mg | ORAL_TABLET | Freq: Two times a day (BID) | ORAL | 0 refills | Status: DC
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
## 2019-01-26 NOTE — Patient Instructions (Signed)
Take the oral generic flagyl for BV as directed Caution- alcohol does not mix well with it   Alert [place] if no improvement   We will check for GC and Chlamydia as well   Update if not starting to improve in a week or if worsening

## 2019-01-28 NOTE — Assessment & Plan Note (Signed)
With clue cells on wet prep Will tx for BV with oral flagyl  Also screen with gc/chlamydia  Update if not starting to improve in a week or if worsening

## 2019-01-29 ENCOUNTER — Telehealth: Payer: Self-pay | Admitting: Family Medicine

## 2019-01-29 LAB — C. TRACHOMATIS/N. GONORRHOEAE RNA
C. trachomatis RNA, TMA: NOT DETECTED
N. gonorrhoeae RNA, TMA: NOT DETECTED

## 2019-01-29 MED ORDER — FLUCONAZOLE 150 MG PO TABS: ORAL_TABLET | ORAL | 0 refills | Status: DC

## 2019-01-29 NOTE — Telephone Encounter (Signed)
Pt notified Rx sent and advised her of Dr. Marliss Coots comments

## 2019-01-29 NOTE — Telephone Encounter (Signed)
Patient saw Dr.Tower on Friday for a bacterial infection and ?yeast infection.  Patient started rx and she was told to call back on Monday, if she had no improvement.  Patient's calling back and it hasn't improved.  She's very irritated. Patient uses [facility].

## 2019-01-29 NOTE — Telephone Encounter (Signed)
I sent diflucan to take one pill by mouth now and repeat in 3 d  Let us know if not starting to improve later this week

## 2019-03-29 ENCOUNTER — Ambulatory Visit
Admission: RE | Admit: 2019-03-29 | Discharge: 2019-03-29 | Disposition: A | Discharge status: Home / Self Care | Payer: Managed Care, Other (non HMO) | Source: Ambulatory Visit | Attending: Primary Care | Admitting: Primary Care

## 2019-03-29 ENCOUNTER — Other Ambulatory Visit: Payer: Self-pay

## 2019-03-29 DIAGNOSIS — Z1239 Encounter for other screening for malignant neoplasm of breast: Secondary | ICD-10-CM

## 2019-04-06 ENCOUNTER — Other Ambulatory Visit: Payer: Self-pay | Admitting: Primary Care

## 2019-04-06 DIAGNOSIS — F411 Generalized anxiety disorder: Secondary | ICD-10-CM

## 2019-05-25 ENCOUNTER — Other Ambulatory Visit: Payer: Self-pay | Admitting: Podiatry

## 2019-05-25 ENCOUNTER — Encounter: Payer: Self-pay | Admitting: Podiatry

## 2019-05-25 ENCOUNTER — Ambulatory Visit (INDEPENDENT_AMBULATORY_CARE_PROVIDER_SITE_OTHER): Payer: Managed Care, Other (non HMO)

## 2019-05-25 ENCOUNTER — Ambulatory Visit: Payer: Managed Care, Other (non HMO) | Admitting: Podiatry

## 2019-05-25 ENCOUNTER — Other Ambulatory Visit: Payer: Self-pay

## 2019-05-25 VITALS — BP 113/70 | HR 72 | Temp 97.6°F | Resp 16

## 2019-05-25 DIAGNOSIS — M79672 Pain in left foot: Secondary | ICD-10-CM

## 2019-05-25 DIAGNOSIS — M722 Plantar fascial fibromatosis: Secondary | ICD-10-CM

## 2019-05-25 MED ORDER — DICLOFENAC SODIUM 75 MG PO TBEC: 75.0000 mg | DELAYED_RELEASE_TABLET | Freq: Two times a day (BID) | ORAL | 2 refills | Status: DC

## 2019-05-25 NOTE — Progress Notes (Signed)
   Subjective:    Patient ID: Wendy Terry, female    DOB: 15-Sep-1975, 44 y.o.   MRN: [id number]  HPI    Review of Systems  All other systems reviewed and are negative.      Objective:   Physical Exam        Assessment & Plan:

## 2019-05-25 NOTE — Patient Instructions (Signed)

## 2019-05-28 NOTE — Progress Notes (Signed)
Subjective:   Patient ID: Wendy Terry, female   DOB: 44 y.o.   MRN: [id number]   HPI Patient presents stating she has had a lot of pain in the bottom of her heel left over right.  States is been going on for over a year and she did have some treatment around 5 months ago which gave minimal relief of her symptoms and she is very frustrated.  States that it affects all aspects of her life and gradually becoming more of an issue.  Patient does not smoke and would like to be more active   Review of Systems  All other systems reviewed and are negative.       Objective:  Physical Exam Vitals and nursing note reviewed.  Constitutional:      Appearance: She is well-developed.  Pulmonary:     Effort: Pulmonary effort is normal.  Musculoskeletal:        General: Normal range of motion.  Skin:    General: Skin is warm.  Neurological:     Mental Status: She is alert.   Neurovascular status found to be intact muscle strength was found to be adequate.  I did note that the patient's left plantar heel is very sore at the insertional point tendon calcaneus with fluid buildup around the medial band and there also is tight gastroc noted upon evaluation.  Patient is found to have good digital perfusion well oriented x3 and does have an over-the-counter AFO device with her       Assessment:  Severe acute plantar fasciitis left with inflammation fluid of the medial band     Plan:  H&P conditions reviewed and at this point I did reviewed the difficulty of this condition and treatment.  I do think ultimately this would require gastroc lengthening and endoscopic release but we will get a try aggressive conservative care and I did do sterile prep injected the fascia 3 mg Kenalog 5 mg Xylocaine at insertion and applied air fracture walker to completely immobilize.  Patient will be seen back in 3 weeks to reevaluate  X-rays indicate there is spur formation no indications of stress fracture  arthritis

## 2019-06-14 ENCOUNTER — Ambulatory Visit: Payer: Managed Care, Other (non HMO) | Admitting: Podiatry

## 2019-07-11 ENCOUNTER — Ambulatory Visit: Payer: Managed Care, Other (non HMO) | Admitting: Podiatry

## 2019-07-11 ENCOUNTER — Other Ambulatory Visit: Payer: Self-pay

## 2019-07-11 ENCOUNTER — Encounter: Payer: Self-pay | Admitting: Podiatry

## 2019-07-11 VITALS — Temp 98.2°F

## 2019-07-11 DIAGNOSIS — M722 Plantar fascial fibromatosis: Secondary | ICD-10-CM

## 2019-07-11 DIAGNOSIS — M7730 Calcaneal spur, unspecified foot: Secondary | ICD-10-CM

## 2019-07-11 NOTE — Progress Notes (Signed)
Subjective:   Patient ID: Wendy Terry, female   DOB: 44 y.o.   MRN: [id number]   HPI Patient presents stating that her heel was doing well and then it started to hurt her bad again and its been recent that is been very sore.  States that it is been getting worse again and she walks 25,000 steps a day   ROS      Objective:  Physical Exam  Neurovascular status intact with discomfort is not as intense left over right plantar fascia with patient not able to wear the boot yet because of no note or digital protection with flatfoot deformity     Assessment:  Patient has had chronic fasciitis that has been treated 6 months ago by another physician has improved with [place] but is present and states that she wants to be able to do a race in June     Plan:  8 NP reviewed condition and recommended the continuation of conservative aggressive treatment and we will start the boot I did sterile prep reinjected the fascia and for the fact there is spur formation along with fasciitis I did cast for functional orthotics to reduce stress on her feet.  Patient will be seen back when ready

## 2019-08-03 ENCOUNTER — Other Ambulatory Visit: Payer: Self-pay | Admitting: Podiatry

## 2019-08-06 ENCOUNTER — Other Ambulatory Visit: Payer: Self-pay

## 2019-08-06 ENCOUNTER — Ambulatory Visit: Payer: Managed Care, Other (non HMO) | Admitting: Orthotics

## 2019-08-06 DIAGNOSIS — M7730 Calcaneal spur, unspecified foot: Secondary | ICD-10-CM

## 2019-08-06 DIAGNOSIS — M722 Plantar fascial fibromatosis: Secondary | ICD-10-CM

## 2019-08-06 NOTE — Progress Notes (Signed)
Patient came in today to pick up custom made foot orthotics.  The goals were accomplished and the patient reported no dissatisfaction with said orthotics.  Patient was advised of breakin period and how to report any issues. 

## 2019-09-06 ENCOUNTER — Other Ambulatory Visit: Payer: Self-pay

## 2019-09-06 ENCOUNTER — Encounter: Payer: Self-pay | Admitting: Primary Care

## 2019-09-06 ENCOUNTER — Ambulatory Visit: Payer: Managed Care, Other (non HMO) | Admitting: Primary Care

## 2019-09-06 VITALS — BP 118/82 | HR 78 | Temp 96.9°F | Ht 63.5 in | Wt 165.5 lb

## 2019-09-06 DIAGNOSIS — M722 Plantar fascial fibromatosis: Secondary | ICD-10-CM

## 2019-09-06 DIAGNOSIS — M2550 Pain in unspecified joint: Secondary | ICD-10-CM | POA: Diagnosis not present

## 2019-09-06 DIAGNOSIS — M791 Myalgia, unspecified site: Secondary | ICD-10-CM | POA: Diagnosis not present

## 2019-09-06 LAB — COMPREHENSIVE METABOLIC PANEL
ALT: 18 U/L (ref 0–35)
AST: 18 U/L (ref 0–37)
Albumin: 4.5 g/dL (ref 3.5–5.2)
Alkaline Phosphatase: 47 U/L (ref 39–117)
BUN: 10 mg/dL (ref 6–23)
CO2: 28 mEq/L (ref 19–32)
Calcium: 9.4 mg/dL (ref 8.4–10.5)
Chloride: 104 mEq/L (ref 96–112)
Creatinine, Ser: 0.78 mg/dL (ref 0.40–1.20)
GFR: 80.79 mL/min (ref >60.00)
Glucose, Bld: 88 mg/dL (ref 70–99)
Potassium: 4.4 mEq/L (ref 3.5–5.1)
Sodium: 138 mEq/L (ref 135–145)
Total Bilirubin: 0.6 mg/dL (ref 0.2–1.2)
Total Protein: 7.1 g/dL (ref 6.0–8.3)

## 2019-09-06 LAB — CBC
HCT: 40.1 % (ref 36.0–46.0)
Hemoglobin: 13.6 g/dL (ref 12.0–15.0)
MCHC: 33.9 g/dL (ref 30.0–36.0)
MCV: 90.8 fl (ref 78.0–100.0)
Platelets: 297 10*3/uL (ref 150.0–400.0)
RBC: 4.42 Mil/uL (ref 3.87–5.11)
RDW: 13.3 % (ref 11.5–15.5)
WBC: 7.2 10*3/uL (ref 4.0–10.5)

## 2019-09-06 LAB — TSH: TSH: 0.69 u[IU]/mL (ref 0.35–4.50)

## 2019-09-06 LAB — MAGNESIUM: Magnesium: 2 mg/dL (ref 1.5–2.5)

## 2019-09-06 LAB — C-REACTIVE PROTEIN: CRP: <1 mg/dL (ref 0.5–20.0)

## 2019-09-06 LAB — VITAMIN D 25 HYDROXY (VIT D DEFICIENCY, FRACTURES): VITD: 34.89 ng/mL (ref 30.00–100.00)

## 2019-09-06 LAB — VITAMIN B12: Vitamin B-12: 534 pg/mL (ref 211–911)

## 2019-09-06 NOTE — Patient Instructions (Signed)
Stop by the lab prior to leaving today. I will notify you of your results once received.   It was a pleasure to see you today!  

## 2019-09-06 NOTE — Assessment & Plan Note (Signed)
Suspect generalized joint and muscle aches are secondary to altered gait from plantar fasciitis pain. Will check inflammatory markers, TSH, vitamin levels, CBC to rule out any other cause.   Following with podiatry, recommended she consider surgery.
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
## 2019-09-07 LAB — ANA: Anti Nuclear Antibody (ANA): NEGATIVE

## 2019-09-07 LAB — CYCLIC CITRUL PEPTIDE ANTIBODY, IGG: Cyclic Citrullin Peptide Ab: <16 UNITS

## 2019-09-07 LAB — RHEUMATOID FACTOR: Rheumatoid fact SerPl-aCnc: <14 IU/mL (ref <14)

## 2019-09-10 ENCOUNTER — Other Ambulatory Visit: Payer: Self-pay

## 2019-09-10 ENCOUNTER — Ambulatory Visit: Payer: Managed Care, Other (non HMO) | Admitting: Primary Care

## 2019-09-10 ENCOUNTER — Encounter: Payer: Self-pay | Admitting: Primary Care

## 2019-09-10 VITALS — BP 122/80 | HR 82 | Temp 96.6°F | Ht 63.5 in | Wt 165.5 lb

## 2019-09-10 DIAGNOSIS — N63 Unspecified lump in unspecified breast: Secondary | ICD-10-CM | POA: Diagnosis not present

## 2019-09-10 NOTE — Assessment & Plan Note (Signed)
Acute over the last 2-3 days. Obvious mass noted on exam at 3-5 o'clock position. Suspect cyst, but will need to rule out other causes. Diagnostic mammogram and ultrasound placed. Await results.

## 2019-09-10 NOTE — Patient Instructions (Signed)
You will be contacted regarding your mammogram and ultrasound.  Please let us know if you have not been contacted within one week.   It was a pleasure to see you today!  
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
## 2019-09-18 ENCOUNTER — Other Ambulatory Visit: Payer: Self-pay

## 2019-09-18 ENCOUNTER — Ambulatory Visit
Admission: RE | Admit: 2019-09-18 | Discharge: 2019-09-18 | Disposition: A | Discharge status: Home / Self Care | Payer: Managed Care, Other (non HMO) | Source: Ambulatory Visit | Attending: Primary Care | Admitting: Primary Care

## 2019-09-18 DIAGNOSIS — N63 Unspecified lump in unspecified breast: Secondary | ICD-10-CM

## 2019-09-25 ENCOUNTER — Emergency Department (HOSPITAL_COMMUNITY)
Admission: EM | Admit: 2019-09-25 | Discharge: 2019-09-26 | Disposition: A | Discharge status: Home / Self Care | Payer: Managed Care, Other (non HMO) | Attending: Emergency Medicine | Admitting: Emergency Medicine

## 2019-09-25 ENCOUNTER — Encounter (HOSPITAL_COMMUNITY): Payer: Self-pay

## 2019-09-25 ENCOUNTER — Emergency Department (HOSPITAL_COMMUNITY): Payer: Managed Care, Other (non HMO)

## 2019-09-25 ENCOUNTER — Other Ambulatory Visit: Payer: Self-pay

## 2019-09-25 DIAGNOSIS — R0781 Pleurodynia: Secondary | ICD-10-CM

## 2019-09-25 DIAGNOSIS — Z79899 Other long term (current) drug therapy: Secondary | ICD-10-CM | POA: Diagnosis not present

## 2019-09-25 DIAGNOSIS — R1010 Upper abdominal pain, unspecified: Secondary | ICD-10-CM | POA: Insufficient documentation

## 2019-09-25 LAB — URINALYSIS, ROUTINE W REFLEX MICROSCOPIC
Bilirubin Urine: NEGATIVE
Glucose, UA: NEGATIVE mg/dL
Hgb urine dipstick: NEGATIVE
Ketones, ur: NEGATIVE mg/dL
Leukocytes,Ua: NEGATIVE
Nitrite: NEGATIVE
Protein, ur: NEGATIVE mg/dL
Specific Gravity, Urine: 1.01 (ref 1.005–1.030)
pH: 7 (ref 5.0–8.0)

## 2019-09-25 LAB — CBC
HCT: 41.6 % (ref 36.0–46.0)
Hemoglobin: 14 g/dL (ref 12.0–15.0)
MCH: 31 pg (ref 26.0–34.0)
MCHC: 33.7 g/dL (ref 30.0–36.0)
MCV: 92 fL (ref 80.0–100.0)
Platelets: 361 K/uL (ref 150–400)
RBC: 4.52 MIL/uL (ref 3.87–5.11)
RDW: 12.5 % (ref 11.5–15.5)
WBC: 10 K/uL (ref 4.0–10.5)
nRBC: 0 % (ref 0.0–0.2)

## 2019-09-25 LAB — BASIC METABOLIC PANEL
Anion gap: 10 (ref 5–15)
BUN: 13 mg/dL (ref 6–20)
CO2: 23 mmol/L (ref 22–32)
Calcium: 9.8 mg/dL (ref 8.9–10.3)
Chloride: 107 mmol/L (ref 98–111)
Creatinine, Ser: 0.74 mg/dL (ref 0.44–1.00)
GFR calc Af Amer: >60 mL/min (ref >60)
GFR calc non Af Amer: >60 mL/min (ref >60)
Glucose, Bld: 100 mg/dL — ABNORMAL HIGH (ref 70–99)
Potassium: 3.8 mmol/L (ref 3.5–5.1)
Sodium: 140 mmol/L (ref 135–145)

## 2019-09-25 LAB — TROPONIN I (HIGH SENSITIVITY)
Troponin I (High Sensitivity): 31 ng/L — ABNORMAL HIGH (ref <18)
Troponin I (High Sensitivity): <2 ng/L (ref <18)

## 2019-09-25 LAB — I-STAT BETA HCG BLOOD, ED (MC, WL, AP ONLY): I-stat hCG, quantitative: <5 m[IU]/mL (ref <5)

## 2019-09-25 MED ORDER — LORAZEPAM 2 MG/ML IJ SOLN
0.5000 mg | Freq: Once | INTRAMUSCULAR | Status: AC
  Administered 2019-09-25: 0.5 mg via INTRAVENOUS
  Filled 2019-09-25: qty 1

## 2019-09-25 MED ORDER — OXYCODONE-ACETAMINOPHEN 5-325 MG PO TABS
1.0000 | ORAL_TABLET | Freq: Once | ORAL | Status: AC
  Administered 2019-09-25: 1 via ORAL
  Filled 2019-09-25: qty 1

## 2019-09-25 MED ORDER — IOHEXOL 350 MG/ML SOLN
75.0000 mL | Freq: Once | INTRAVENOUS | Status: AC | PRN
  Administered 2019-09-25: 75 mL via INTRAVENOUS

## 2019-09-25 MED ORDER — HYDROMORPHONE HCL 1 MG/ML IJ SOLN
0.5000 mg | Freq: Once | INTRAMUSCULAR | Status: AC
  Administered 2019-09-25: 0.5 mg via INTRAVENOUS
  Filled 2019-09-25: qty 1

## 2019-09-25 MED ORDER — HYDROMORPHONE HCL 1 MG/ML IJ SOLN
0.5000 mg | Freq: Once | INTRAMUSCULAR | Status: AC
  Administered 2019-09-25: 0.5 mg via INTRAMUSCULAR
  Filled 2019-09-25: qty 1

## 2019-09-25 NOTE — ED Notes (Signed)
Pt ambulated to bathroom, with assistance. Steady on her feet, but painful.
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
## 2019-09-25 NOTE — ED Triage Notes (Signed)
Pt reports epigastric pain that goes thru to her back, hx of pleurisy last year and it feels similar. Pt seems uncomfortable in triage. Pt saw a chiropractor yesterday and thinks maybe that made it worse.

## 2019-09-25 NOTE — ED Notes (Signed)
Pt reporting worsening cp in lobby, tearful. [place], CNA did repeat EKG. Pt reassured she will be seen by a provider soon.

## 2019-09-26 LAB — HEPATIC FUNCTION PANEL
ALT: 23 U/L (ref 0–44)
AST: 24 U/L (ref 15–41)
Albumin: 4 g/dL (ref 3.5–5.0)
Alkaline Phosphatase: 45 U/L (ref 38–126)
Bilirubin, Direct: 0.2 mg/dL (ref 0.0–0.2)
Indirect Bilirubin: 0.4 mg/dL (ref 0.3–0.9)
Total Bilirubin: 0.6 mg/dL (ref 0.3–1.2)
Total Protein: 7.5 g/dL (ref 6.5–8.1)

## 2019-09-26 LAB — LIPASE, BLOOD: Lipase: 27 U/L (ref 11–51)

## 2019-09-26 MED ORDER — HYDROCODONE-ACETAMINOPHEN 5-325 MG PO TABS: 1.0000 | ORAL_TABLET | Freq: Four times a day (QID) | ORAL | 0 refills | Status: DC | PRN

## 2019-09-26 MED ORDER — PREDNISONE 10 MG (21) PO TBPK
ORAL_TABLET | Freq: Every day | ORAL | 0 refills | Status: DC
Start: 2019-09-26 — End: 2019-11-19

## 2019-09-26 MED ORDER — ONDANSETRON 4 MG PO TBDP
4.0000 mg | ORAL_TABLET | Freq: Three times a day (TID) | ORAL | 0 refills | Status: DC | PRN
Start: 2019-09-26 — End: 2019-10-01

## 2019-09-26 MED ORDER — KETOROLAC TROMETHAMINE 30 MG/ML IJ SOLN
30.0000 mg | Freq: Once | INTRAMUSCULAR | Status: AC
  Administered 2019-09-26: 30 mg via INTRAVENOUS
  Filled 2019-09-26: qty 1

## 2019-09-26 MED ORDER — CYCLOBENZAPRINE HCL 10 MG PO TABS
10.0000 mg | ORAL_TABLET | Freq: Two times a day (BID) | ORAL | 0 refills | Status: DC | PRN
Start: 2019-09-26 — End: 2019-11-19

## 2019-09-26 MED ORDER — ONDANSETRON 4 MG PO TBDP
4.0000 mg | ORAL_TABLET | Freq: Once | ORAL | Status: AC
  Administered 2019-09-26: 4 mg via ORAL
  Filled 2019-09-26: qty 1

## 2019-09-26 MED ORDER — CYCLOBENZAPRINE HCL 10 MG PO TABS
5.0000 mg | ORAL_TABLET | Freq: Once | ORAL | Status: AC
  Administered 2019-09-26: 5 mg via ORAL
  Filled 2019-09-26: qty 1

## 2019-09-26 NOTE — Discharge Instructions (Addendum)
1. Medications: Take steroid taper as prescribed with food to avoid upset stomach issues.  Do not take ibuprofen, Advil, Aleve, or Motrin while taking this medicine.  You may take [phone] mg of Tylenol every 6 hours as needed for pain. Do not exceed 4000 mg of Tylenol daily.  You can take hydrocodone as needed for severe pain but do not drive, drink alcohol, or operate heavy machinery while taking this medicine as it can cause drowsiness.  Be aware this medicine also contains Tylenol and do not exceed more than 4000 mg of Tylenol daily.  You can take Zofran as needed for nausea. You can take Flexeril as needed for muscle relaxation but this medication may make you drowsy so do not drive, drink alcohol, operate heavy machinery, or make important decisions while you are using this medicine.  I typically recommend only taking this medicine at night.  You can also cut these tablets in half if they are very strong. Be careful if you plan to take your gabapentin as this medicine can also cause drowsiness.  Try not to take the gabapentin, Flexeril and hydrocodone at the same time.  Try to spread these medications out with at least 6 hours in between dosages of these medicines. 2. Treatment: rest, drink plenty of fluids, apply heat 20 minutes at a time as often as needed.  Can also apply topical over-the-counter pain medicines or patches such as icy hot or Salonpas. 3. Follow Up: Please followup with cardiology as directed or your PCP for discussion of your diagnoses and further evaluation after today's visit; return to the emergency department if any concerning signs or symptoms develop such as fevers, loss of consciousness, worsening pain, low oxygen saturations, or worsening shortness of breath.

## 2019-09-28 ENCOUNTER — Encounter: Payer: Self-pay | Admitting: Cardiology

## 2019-09-28 ENCOUNTER — Ambulatory Visit: Payer: Managed Care, Other (non HMO) | Admitting: Cardiology

## 2019-09-28 ENCOUNTER — Other Ambulatory Visit: Payer: Self-pay

## 2019-09-28 VITALS — BP 110/78 | HR 71 | Ht 63.0 in | Wt 167.1 lb

## 2019-09-28 DIAGNOSIS — R079 Chest pain, unspecified: Secondary | ICD-10-CM

## 2019-09-28 NOTE — Patient Instructions (Signed)
Medication Instructions:  No changes   *If you need a refill on your cardiac medications before your next appointment, please call your pharmacy*   Lab Work: None  If you have labs (blood work) drawn today and your tests are completely normal, you will receive your results only by: . MyChart Message (if you have MyChart) OR . A paper copy in the mail If you have any lab test that is abnormal or we need to change your treatment, we will call you to review the results.   Testing/Procedures: None   Follow-Up: At [facility], you and your health needs are our priority.  As part of our continuing mission to provide you with exceptional heart care, we have created designated Provider Care Teams.  These Care Teams include your primary Cardiologist (physician) and Advanced Practice Providers (APPs -  Physician Assistants and Nurse Practitioners) who all work together to provide you with the care you need, when you need it.   Your next appointment:   Follow up as needed.    
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
## 2019-10-01 ENCOUNTER — Ambulatory Visit (INDEPENDENT_AMBULATORY_CARE_PROVIDER_SITE_OTHER): Payer: Managed Care, Other (non HMO) | Admitting: Primary Care

## 2019-10-01 ENCOUNTER — Other Ambulatory Visit: Payer: Self-pay

## 2019-10-01 ENCOUNTER — Encounter: Payer: Self-pay | Admitting: Primary Care

## 2019-10-01 VITALS — BP 120/76 | HR 80 | Temp 96.6°F | Ht 63.5 in | Wt 166.5 lb

## 2019-10-01 DIAGNOSIS — E785 Hyperlipidemia, unspecified: Secondary | ICD-10-CM

## 2019-10-01 DIAGNOSIS — F411 Generalized anxiety disorder: Secondary | ICD-10-CM | POA: Diagnosis not present

## 2019-10-01 DIAGNOSIS — N898 Other specified noninflammatory disorders of vagina: Secondary | ICD-10-CM | POA: Diagnosis not present

## 2019-10-01 DIAGNOSIS — R079 Chest pain, unspecified: Secondary | ICD-10-CM | POA: Insufficient documentation

## 2019-10-01 DIAGNOSIS — Z0000 Encounter for general adult medical examination without abnormal findings: Secondary | ICD-10-CM | POA: Diagnosis not present

## 2019-10-01 DIAGNOSIS — M722 Plantar fascial fibromatosis: Secondary | ICD-10-CM

## 2019-10-01 LAB — LIPID PANEL
Cholesterol: 213 mg/dL — ABNORMAL HIGH (ref 0–200)
HDL: 59.6 mg/dL (ref >39.00)
LDL Cholesterol: 138 mg/dL — ABNORMAL HIGH (ref 0–99)
NonHDL: 153.73
Total CHOL/HDL Ratio: 4
Triglycerides: 81 mg/dL (ref 0.0–149.0)
VLDL: 16.2 mg/dL (ref 0.0–40.0)

## 2019-10-01 NOTE — Assessment & Plan Note (Signed)
Following with podiatry, planning on surgery in the next coming weeks.

## 2019-10-01 NOTE — Patient Instructions (Addendum)
Stop by the lab prior to leaving today. I will notify you of your results once received.   Continue to work on a healthy diet, regular exercise. Ensure you are consuming 64 ounces of water daily.  We will be in touch regarding your vaginal swab.  You will be contacted regarding your referral to psychiatry.  Please let us know if you have not been contacted within two weeks.   It was a pleasure to see you today!   Preventive Care 71-43 Years Old, Female Preventive care refers to visits with your health care provider and lifestyle choices that can promote health and wellness. This includes:  A yearly physical exam. This may also be called an annual well check.  Regular dental visits and eye exams.  Immunizations.  Screening for certain conditions.  Healthy lifestyle choices, such as eating a healthy diet, getting regular exercise, not using drugs or products that contain nicotine and tobacco, and limiting alcohol use. What can I expect for my preventive care visit? Physical exam Your health care provider will check your:  Height and weight. This may be used to calculate body mass index (BMI), which tells if you are at a healthy weight.  Heart rate and blood pressure.  Skin for abnormal spots. Counseling Your health care provider may ask you questions about your:  Alcohol, tobacco, and drug use.  Emotional well-being.  Home and relationship well-being.  Sexual activity.  Eating habits.  Work and work Statistician.  Method of birth control.  Menstrual cycle.  Pregnancy history. What immunizations do I need?  Influenza (flu) vaccine  This is recommended every year. Tetanus, diphtheria, and pertussis (Tdap) vaccine  You may need a Td booster every 10 years. Varicella (chickenpox) vaccine  You may need this if you have not been vaccinated. Zoster (shingles) vaccine  You may need this after age 40. Measles, mumps, and rubella (MMR) vaccine  You may need at  least one dose of MMR if you were born in 1957 or later. You may also need a second dose. Pneumococcal conjugate (PCV13) vaccine  You may need this if you have certain conditions and were not previously vaccinated. Pneumococcal polysaccharide (PPSV23) vaccine  You may need one or two doses if you smoke cigarettes or if you have certain conditions. Meningococcal conjugate (MenACWY) vaccine  You may need this if you have certain conditions. Hepatitis A vaccine  You may need this if you have certain conditions or if you travel or work in places where you may be exposed to hepatitis A. Hepatitis B vaccine  You may need this if you have certain conditions or if you travel or work in places where you may be exposed to hepatitis B. Haemophilus influenzae type b (Hib) vaccine  You may need this if you have certain conditions. Human papillomavirus (HPV) vaccine  If recommended by your health care provider, you may need three doses over 6 months. You may receive vaccines as individual doses or as more than one vaccine together in one shot (combination vaccines). Talk with your health care provider about the risks and benefits of combination vaccines. What tests do I need? Blood tests  Lipid and cholesterol levels. These may be checked every 5 years, or more frequently if you are over 48 years old.  Hepatitis C test.  Hepatitis B test. Screening  Lung cancer screening. You may have this screening every year starting at age 63 if you have a 30-pack-year history of smoking and currently smoke or have  quit within the past 15 years.  Colorectal cancer screening. All adults should have this screening starting at age 68 and continuing until age 17. Your health care provider may recommend screening at age 66 if you are at increased risk. You will have tests every 1-10 years, depending on your results and the type of screening test.  Diabetes screening. This is done by checking your blood sugar  (glucose) after you have not eaten for a while (fasting). You may have this done every 1-3 years.  Mammogram. This may be done every 1-2 years. Talk with your health care provider about when you should start having regular mammograms. This may depend on whether you have a family history of breast cancer.  BRCA-related cancer screening. This may be done if you have a family history of breast, ovarian, tubal, or peritoneal cancers.  Pelvic exam and Pap test. This may be done every 3 years starting at age 12. Starting at age 13, this may be done every 5 years if you have a Pap test in combination with an HPV test. Other tests  Sexually transmitted disease (STD) testing.  Bone density scan. This is done to screen for osteoporosis. You may have this scan if you are at high risk for osteoporosis. Follow these instructions at home: Eating and drinking  Eat a diet that includes fresh fruits and vegetables, whole grains, lean protein, and low-fat dairy.  Take vitamin and mineral supplements as recommended by your health care provider.  Do not drink alcohol if: ? Your health care provider tells you not to drink. ? You are pregnant, may be pregnant, or are planning to become pregnant.  If you drink alcohol: ? Limit how much you have to 0-1 drink a day. ? Be aware of how much alcohol is in your drink. In the [place] one drink equals one 12 oz bottle of beer (355 mL), one 5 oz glass of wine (148 mL), or one 1 oz glass of hard liquor (44 mL). Lifestyle  Take daily care of your teeth and gums.  Stay active. Exercise for at least 30 minutes on 5 or more days each week.  Do not use any products that contain nicotine or tobacco, such as cigarettes, e-cigarettes, and chewing tobacco. If you need help quitting, ask your health care provider.  If you are sexually active, practice safe sex. Use a condom or other form of birth control (contraception) in order to prevent pregnancy and STIs (sexually  transmitted infections).  If told by your health care provider, take low-dose aspirin daily starting at age 8. What's next?  Visit your health care provider once a year for a well check visit.  Ask your health care provider how often you should have your eyes and teeth checked.  Stay up to date on all vaccines. This information is not intended to replace advice given to you by your health care provider. Make sure you discuss any questions you have with your health care provider. Document Revised: 12/15/2017 Document Reviewed: 12/15/2017 Elsevier Patient Education  2020 [employer].

## 2019-10-01 NOTE — Assessment & Plan Note (Signed)
Acute for the last week. She prefers to self swab. Wet prep collected and pending for evaluation.

## 2019-10-01 NOTE — Assessment & Plan Note (Signed)
Continued symptoms, could not tolerate Lexapro, doesn't feel citalopram is effective. She would like to see psychiatry for ADHD testing and anxiety evaluation. Referral placed.

## 2019-10-01 NOTE — Assessment & Plan Note (Signed)
Immunizations UTD. Pap smear UTD.  Encouraged a healthy diet and regular exercise.  Exam today stable. Labs pending. 
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
## 2019-10-01 NOTE — Assessment & Plan Note (Signed)
Acute, evaluated at [facility] ED last week. Work up negative for cardiac cause. Evaluated by cardiology last week, low suspicion for cardiac cause.  Improving with prednisone.  Do suspect anxiety to be playing a role. She declines further treatment for anxiety, referral placed for psychiatry.

## 2019-10-01 NOTE — Assessment & Plan Note (Signed)
Repeat lipid panel pending. Encouraged exercise, healthy diet.

## 2019-10-02 DIAGNOSIS — N760 Acute vaginitis: Secondary | ICD-10-CM

## 2019-10-02 LAB — WET PREP BY MOLECULAR PROBE
Candida species: NOT DETECTED
MICRO NUMBER:: 10587758
SPECIMEN QUALITY:: ADEQUATE
Trichomonas vaginosis: NOT DETECTED

## 2019-10-03 MED ORDER — METRONIDAZOLE 500 MG PO TABS: 500.0000 mg | ORAL_TABLET | Freq: Two times a day (BID) | ORAL | 0 refills | Status: DC

## 2019-10-26 ENCOUNTER — Other Ambulatory Visit: Payer: Self-pay

## 2019-10-26 ENCOUNTER — Ambulatory Visit (INDEPENDENT_AMBULATORY_CARE_PROVIDER_SITE_OTHER): Payer: Managed Care, Other (non HMO) | Admitting: Podiatry

## 2019-10-26 ENCOUNTER — Encounter: Payer: Self-pay | Admitting: Podiatry

## 2019-10-26 DIAGNOSIS — M722 Plantar fascial fibromatosis: Secondary | ICD-10-CM

## 2019-10-26 DIAGNOSIS — M216X2 Other acquired deformities of left foot: Secondary | ICD-10-CM | POA: Diagnosis not present

## 2019-10-26 DIAGNOSIS — M216X1 Other acquired deformities of right foot: Secondary | ICD-10-CM | POA: Diagnosis not present

## 2019-10-26 NOTE — Progress Notes (Signed)
Subjective:   Patient ID: Wendy Terry, female   DOB: 44 y.o.   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  Patient wants surgery and needs to get it done right away due to schedule and I allowed her to read consent form going over alternative treatments complications and after reviewing she signed consent form.  She understands recovery can take upwards of 6 months and there is no long-term guarantees and I dispensed a high air fracture walker for the postoperative.  With encouragement to wear it now.  Patient is to call with any questions concerns which may arise prior to procedure

## 2019-11-02 ENCOUNTER — Telehealth: Payer: Self-pay

## 2019-11-02 NOTE — Telephone Encounter (Signed)
DOS 11/13/19  EPF LT - 94473 GASTROCNEMIUS RECESS LT - 95844  [employer] EFFECTIVE DATE - 04/19/18  PLAN DEDUCTIBLE - $3750.00 W. $3750.00 MET OUT OF POCKET - $7900.00 W/ $5892.31 MET COPAY $0.00 COINSURANCE - 70%  PER AUTOMATED SYSTEM CPT 17127 & 87183 DO NOT NEED PRECERT  67255 CONF # - [id number] CONF # - [id number]

## 2019-11-12 MED ORDER — HYDROCODONE-ACETAMINOPHEN 10-325 MG PO TABS: 1.0000 | ORAL_TABLET | ORAL | 0 refills | Status: DC | PRN

## 2019-11-12 NOTE — Addendum Note (Signed)
Addended by: Lenn Sink on: 11/12/2019 03:35 PM   Modules accepted: Orders

## 2019-11-13 DIAGNOSIS — M722 Plantar fascial fibromatosis: Secondary | ICD-10-CM | POA: Diagnosis not present

## 2019-11-13 DIAGNOSIS — M216X2 Other acquired deformities of left foot: Secondary | ICD-10-CM

## 2019-11-16 ENCOUNTER — Telehealth: Payer: Self-pay | Admitting: Podiatry

## 2019-11-16 DIAGNOSIS — Z9889 Other specified postprocedural states: Secondary | ICD-10-CM

## 2019-11-16 MED ORDER — METOCLOPRAMIDE HCL 10 MG PO TABS
10.0000 mg | ORAL_TABLET | Freq: Three times a day (TID) | ORAL | 1 refills | Status: DC | PRN
Start: 2019-11-16 — End: 2020-02-27

## 2019-11-16 MED ORDER — GABAPENTIN 300 MG PO CAPS
300.0000 mg | ORAL_CAPSULE | Freq: Three times a day (TID) | ORAL | 3 refills | Status: DC
Start: 2019-11-16 — End: 2020-02-27

## 2019-11-16 MED ORDER — OXYCODONE-ACETAMINOPHEN 10-325 MG PO TABS: ORAL_TABLET | ORAL | 0 refills | Status: DC

## 2019-11-16 NOTE — Telephone Encounter (Signed)
Received page, she is having p/op N/V. Advised to d/c hydrocodone and switched to oxycodone. Rx for gabapentin for overall pain. Rx for reglan for p/op N/V.

## 2019-11-16 NOTE — Telephone Encounter (Signed)
I spoke with pt and she states she was able to speak with the on-call doctor and he took care of the problem.

## 2019-11-16 NOTE — Telephone Encounter (Signed)
Pt's friend called in and requested nausea medicine for the patient. The patient is really nauseas.

## 2019-11-19 ENCOUNTER — Ambulatory Visit (INDEPENDENT_AMBULATORY_CARE_PROVIDER_SITE_OTHER): Payer: Managed Care, Other (non HMO) | Admitting: Podiatry

## 2019-11-19 ENCOUNTER — Other Ambulatory Visit: Payer: Self-pay

## 2019-11-19 ENCOUNTER — Encounter: Payer: Self-pay | Admitting: Podiatry

## 2019-11-19 VITALS — BP 105/71 | HR 86 | Temp 98.2°F | Resp 16

## 2019-11-19 DIAGNOSIS — M722 Plantar fascial fibromatosis: Secondary | ICD-10-CM

## 2019-11-19 DIAGNOSIS — M216X1 Other acquired deformities of right foot: Secondary | ICD-10-CM

## 2019-11-19 DIAGNOSIS — M216X2 Other acquired deformities of left foot: Secondary | ICD-10-CM

## 2019-11-20 NOTE — Progress Notes (Signed)
Subjective:   Patient ID: Wendy Terry, female   DOB: 44 y.o.   MRN: [id number]   HPI Patient states doing really well with surgery with minimal discomfort   ROS      Objective:  Physical Exam  Neurovascular status intact with patient's left foot healing well minimal discomfort wound edges well coapted stitches intact     Assessment:  Doing well post endoscopic surgery     Plan:  Sterile dressing reapplied advised on continued elevation compression immobilization reappoint 2 weeks suture removal or earlier if needed with negative Denna Haggard' sign noted today

## 2019-11-28 ENCOUNTER — Encounter: Payer: Self-pay | Admitting: Podiatry

## 2019-12-05 ENCOUNTER — Other Ambulatory Visit: Payer: Self-pay

## 2019-12-05 ENCOUNTER — Ambulatory Visit (INDEPENDENT_AMBULATORY_CARE_PROVIDER_SITE_OTHER): Payer: Managed Care, Other (non HMO) | Admitting: Podiatry

## 2019-12-05 ENCOUNTER — Encounter: Payer: Self-pay | Admitting: Podiatry

## 2019-12-05 VITALS — Temp 97.7°F

## 2019-12-05 DIAGNOSIS — M722 Plantar fascial fibromatosis: Secondary | ICD-10-CM | POA: Diagnosis not present

## 2019-12-05 DIAGNOSIS — M216X2 Other acquired deformities of left foot: Secondary | ICD-10-CM

## 2019-12-05 DIAGNOSIS — M216X1 Other acquired deformities of right foot: Secondary | ICD-10-CM

## 2019-12-06 NOTE — Progress Notes (Signed)
Subjective:   Patient ID: Wendy Terry, female   DOB: 44 y.o.   MRN: [id number]   HPI Patient states she is doing better with the left foot and states that she is very happy with what is happening with the surgery so far   ROS      Objective:  Physical Exam  Neurovascular status intact negative Denna Haggard' sign was noted with patient's incision site on the left lower calf and heel doing well wound edges well coapted good range of motion with minimal plantar pain     Assessment:  Doing well post endoscopic surgery gastroc recession left     Plan:  H&P reviewed condition recommended the continuation of anti-inflammatories stitches removed wound edges coapted well applied sterile dressings and will be seen back to recheck

## 2020-01-10 ENCOUNTER — Encounter: Payer: Managed Care, Other (non HMO) | Admitting: Podiatry

## 2020-01-17 ENCOUNTER — Encounter: Payer: Self-pay | Admitting: Podiatry

## 2020-01-17 ENCOUNTER — Other Ambulatory Visit: Payer: Self-pay

## 2020-01-17 ENCOUNTER — Ambulatory Visit (INDEPENDENT_AMBULATORY_CARE_PROVIDER_SITE_OTHER): Payer: Managed Care, Other (non HMO) | Admitting: Podiatry

## 2020-01-17 DIAGNOSIS — M722 Plantar fascial fibromatosis: Secondary | ICD-10-CM

## 2020-01-17 DIAGNOSIS — M216X1 Other acquired deformities of right foot: Secondary | ICD-10-CM

## 2020-01-17 DIAGNOSIS — M216X2 Other acquired deformities of left foot: Secondary | ICD-10-CM

## 2020-01-17 MED ORDER — MELOXICAM 15 MG PO TABS
15.0000 mg | ORAL_TABLET | Freq: Every day | ORAL | 2 refills | Status: DC
Start: 2020-01-17 — End: 2020-02-27

## 2020-01-17 NOTE — Progress Notes (Signed)
Subjective:   Patient ID: Wendy Terry, female   DOB: 44 y.o.   MRN: [id number]   HPI Patient states doing well but does not feel like she is ready to return to work at this time   ROS      Objective:  Physical Exam  Neurovascular status intact with significant diminishment of discomfort plantar aspect left heel arch pain still present when she is on her foot for periods of time     Assessment:  Doing well after having endoscopic surgery and surgery for equinus condition with mild discomfort     Plan:  Reviewed mild discomfort the continuation of stretching exercise anti-inflammatory physical therapy and hopeful return to work by mid October.  Encouraged her to begin shoe usage and reappoint as needed

## 2020-02-18 ENCOUNTER — Ambulatory Visit (INDEPENDENT_AMBULATORY_CARE_PROVIDER_SITE_OTHER): Payer: 59 | Admitting: Psychiatry

## 2020-02-18 ENCOUNTER — Other Ambulatory Visit: Payer: Self-pay

## 2020-02-18 DIAGNOSIS — F411 Generalized anxiety disorder: Secondary | ICD-10-CM | POA: Diagnosis not present

## 2020-02-18 DIAGNOSIS — F4323 Adjustment disorder with mixed anxiety and depressed mood: Secondary | ICD-10-CM

## 2020-02-18 DIAGNOSIS — Z630 Problems in relationship with spouse or partner: Secondary | ICD-10-CM

## 2020-02-18 DIAGNOSIS — Z658 Other specified problems related to psychosocial circumstances: Secondary | ICD-10-CM | POA: Diagnosis not present
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
## 2020-02-27 ENCOUNTER — Ambulatory Visit (INDEPENDENT_AMBULATORY_CARE_PROVIDER_SITE_OTHER): Payer: Managed Care, Other (non HMO) | Admitting: Primary Care

## 2020-02-27 ENCOUNTER — Encounter: Payer: Self-pay | Admitting: Primary Care

## 2020-02-27 ENCOUNTER — Other Ambulatory Visit: Payer: Self-pay

## 2020-02-27 VITALS — BP 114/70 | HR 74 | Temp 98.7°F | Wt 180.0 lb

## 2020-02-27 DIAGNOSIS — F411 Generalized anxiety disorder: Secondary | ICD-10-CM

## 2020-02-27 DIAGNOSIS — R41840 Attention and concentration deficit: Secondary | ICD-10-CM | POA: Insufficient documentation

## 2020-02-27 MED ORDER — VENLAFAXINE HCL ER 37.5 MG PO CP24: 37.5000 mg | ORAL_CAPSULE | Freq: Every day | ORAL | 1 refills | Status: DC

## 2020-02-27 NOTE — Assessment & Plan Note (Signed)
Suspect some of her symptoms are secondary to anxiety, but cannot completely exclude ADHD.  She is ready for medication treatment for anxiety.  She has failed 2 SSRIs, will proceed with venlafaxine ER 37.5 mg, especially given reports of hot flashes.  Continue with therapy. Follow-up in 6 weeks.
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
## 2020-02-27 NOTE — Assessment & Plan Note (Signed)
Chronic for years, more apparent recently.  Suspect a portion of the symptoms are secondary to anxiety, but cannot exclude ADHD.  She will seek out ADHD evaluation through [facility] as she is already an established patient.  She will reach out once she has been tested.  We will treat anxiety with venlafaxine course.

## 2020-02-27 NOTE — Patient Instructions (Addendum)
Start venlafaxine ER 37.5 mg (Effexor) once daily for anxiety.  Schedule formal ADHD testing through [facility] as discussed.  Please schedule a follow up visit for 6 weeks for follow up of anxiety.  It was a pleasure to see you today!

## 2020-03-05 ENCOUNTER — Other Ambulatory Visit: Payer: Self-pay

## 2020-03-05 ENCOUNTER — Ambulatory Visit (INDEPENDENT_AMBULATORY_CARE_PROVIDER_SITE_OTHER): Payer: 59 | Admitting: Psychiatry

## 2020-03-05 DIAGNOSIS — F4323 Adjustment disorder with mixed anxiety and depressed mood: Secondary | ICD-10-CM | POA: Diagnosis not present

## 2020-03-05 DIAGNOSIS — Z630 Problems in relationship with spouse or partner: Secondary | ICD-10-CM | POA: Diagnosis not present

## 2020-03-05 DIAGNOSIS — Z658 Other specified problems related to psychosocial circumstances: Secondary | ICD-10-CM

## 2020-03-05 DIAGNOSIS — F411 Generalized anxiety disorder: Secondary | ICD-10-CM | POA: Diagnosis not present
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
## 2020-03-18 ENCOUNTER — Other Ambulatory Visit: Payer: Self-pay

## 2020-03-18 ENCOUNTER — Ambulatory Visit (INDEPENDENT_AMBULATORY_CARE_PROVIDER_SITE_OTHER): Payer: 59 | Admitting: Psychiatry

## 2020-03-18 DIAGNOSIS — Z658 Other specified problems related to psychosocial circumstances: Secondary | ICD-10-CM

## 2020-03-18 DIAGNOSIS — F4323 Adjustment disorder with mixed anxiety and depressed mood: Secondary | ICD-10-CM | POA: Diagnosis not present

## 2020-03-18 DIAGNOSIS — F411 Generalized anxiety disorder: Secondary | ICD-10-CM | POA: Diagnosis not present

## 2020-03-18 DIAGNOSIS — Z630 Problems in relationship with spouse or partner: Secondary | ICD-10-CM | POA: Diagnosis not present
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
## 2020-03-19 ENCOUNTER — Ambulatory Visit: Payer: 59 | Admitting: Psychiatry

## 2020-03-21 ENCOUNTER — Other Ambulatory Visit: Payer: Self-pay | Admitting: Primary Care

## 2020-03-21 DIAGNOSIS — F411 Generalized anxiety disorder: Secondary | ICD-10-CM

## 2020-04-02 ENCOUNTER — Ambulatory Visit (INDEPENDENT_AMBULATORY_CARE_PROVIDER_SITE_OTHER): Payer: 59 | Admitting: Psychiatry

## 2020-04-02 ENCOUNTER — Other Ambulatory Visit: Payer: Self-pay

## 2020-04-02 DIAGNOSIS — F411 Generalized anxiety disorder: Secondary | ICD-10-CM

## 2020-04-02 DIAGNOSIS — F4323 Adjustment disorder with mixed anxiety and depressed mood: Secondary | ICD-10-CM | POA: Diagnosis not present

## 2020-04-02 DIAGNOSIS — Z630 Problems in relationship with spouse or partner: Secondary | ICD-10-CM | POA: Diagnosis not present

## 2020-04-02 DIAGNOSIS — Z658 Other specified problems related to psychosocial circumstances: Secondary | ICD-10-CM

## 2020-04-02 NOTE — Progress Notes (Signed)
Psychotherapy Progress Note [facility] Wendy Czar, PhD LP  Patient ID: Wendy Terry)    MRN: [id number] Therapy format: Individual psychotherapy Date: 04/02/2020      Start: 11:10a     Stop: 12:00n     Time Spent: 50 min Location: In-person   Session narrative (presenting needs, interim history, self-report of stressors and symptoms, applications of prior therapy, status changes, and interventions made in session) Trying to maintain the Christmas spirit, largely by not mixing in stores with crowds of irritable people.  Job interview tomorrow, looking favorable, and good chance of a $10K raise.  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  Also sees a glass ceiling for opportunity to promote and grow.  No hard feelings with the company, good 4 years overall, just a pileup of inequities and imbalance of being depended on harder and compensated lighter.    Meanwhile, living situation is short on privacy, long on conflict between relatives.  Feels a little incestuous renting from family, as well, may need to get out of that dual relationship as well.  Affirmed and encouraged.  Has shared with Wendy Terry that she is interviewing for other work and that it is here.  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  Confirmed that she is making this choice voluntarily, not codependently, and with clear sight.  Has discovered recently that there have been a few people leaving his congregation, suspicion that they may have done some digging and learned of her past, made self-righteous objections.  While she is no longer held hostage by her story or intimidated by that kind of thing, Wendy Terry still might, so one way to help could be to show up and be seen for who she is now without  explaining or managing.    Is seeing Wendy Terry throw out plenty of playful ideas to come see her but not taking her up on any actual offers, still with double messages about being closer.  Knows he is dominated by anxiety, seemingly addicted to the role of Higher education careers adviser, which he does very well as a Education officer, environmental.  He continues to flatter her and seems to box her in still.  Discussed responses, prepared for Christmas visit, clarified values and boundary considerations.  Therapeutic modalities: Cognitive Behavioral Therapy, Solution-Oriented/Positive Psychology, Ego-Supportive, Faith-sensitive and Assertiveness/Communication  Mental Status/Observations:  Appearance:   Casual     Behavior:  Appropriate  Motor:  Normal  Speech/Language:   Clear and Coherent  Affect:  Appropriate  Mood:  normal and some anxiety, worry  Thought process:  normal  Thought content:    WNL  Sensory/Perceptual disturbances:    WNL  Orientation:  Fully oriented  Attention:  Good    Concentration:  Good  Memory:  WNL  Insight:    Good  Judgment:   Good  Impulse Control:  Good   Risk Assessment: Danger to Self: No Self-injurious Behavior: No Danger to Others: No Physical Aggression / Violence: No Duty to Warn: No Access to Firearms a concern: No  Assessment of progress:  progressing  Diagnosis:   ICD-10-CM   1. GAD (generalized anxiety disorder)  F41.1   2. Adjustment disorder with mixed anxiety and depressed mood  F43.23   3. Relationship problem between partners  Z63.0  4. Spiritual problem  Z65.8    Plan:  . Continue job-seeking as motivated . Will visit as planned, is ready to address emotional manipulation if it arises . Maintain general health, sleep, work limits . Other recommendations/advice as may be noted above . Continue to utilize previously learned skills ad lib . Maintain medication as prescribed and work faithfully with relevant prescriber(s) if any changes are desired or seem  indicated . Call the clinic on-call service, present to ER, or call 911 if any life-threatening psychiatric crisis Return in about 2 weeks (around 04/16/2020). . Already scheduled visit in this office 04/16/2020.  Robley Fries, PhD Wendy Czar, PhD LP Clinical Psychologist, [facility] [address] [place], [place] [address] [phone]

## 2020-04-16 ENCOUNTER — Ambulatory Visit (INDEPENDENT_AMBULATORY_CARE_PROVIDER_SITE_OTHER): Payer: 59 | Admitting: Psychiatry

## 2020-04-16 ENCOUNTER — Other Ambulatory Visit: Payer: Self-pay

## 2020-04-16 DIAGNOSIS — Z630 Problems in relationship with spouse or partner: Secondary | ICD-10-CM | POA: Diagnosis not present

## 2020-04-16 DIAGNOSIS — F411 Generalized anxiety disorder: Secondary | ICD-10-CM | POA: Diagnosis not present

## 2020-04-16 DIAGNOSIS — F324 Major depressive disorder, single episode, in partial remission: Secondary | ICD-10-CM | POA: Diagnosis not present

## 2020-04-16 DIAGNOSIS — Z658 Other specified problems related to psychosocial circumstances: Secondary | ICD-10-CM | POA: Diagnosis not present

## 2020-04-16 NOTE — Progress Notes (Signed)
Psychotherapy Progress Note [facility] [name], PhD LP  Patient ID: Wendy Terry)    MRN: [id number] Therapy format: Individual psychotherapy Date: 04/16/2020      Start: 11:10a     Stop: 12:00n     Time Spent: 50 min Location: In-person   Session narrative (presenting needs, interim history, self-report of stressors and symptoms, applications of prior therapy, status changes, and interventions made in session) 3rd interview on Jan 3.  Looks favorable, will come down to whether the raise is enough to warrant moving.  Clearly still well-regarded where she is, and clearly in need of rectifying imbalances with subordinate employees having better hours and pay.    Been sleeping extra the past week, both days on and days off.  Reasonable suspicion she's feeling the dread of the employment confrontation, the marital one, escaping thinking about emotional dilemmas, or all three.  Did visit Tommy, as planned, for the play/dinner at his church, endured his false advertising that she prepared the dinner, and feels forced to become part of the lie he likes to perpetrate for PR's sake.  He was irritated about a hospitality issue at the church function, took it out with her by exaggerating complaint about her not being out to see him (itself a glaring double standard).  Been leaning further toward divorce, after 11 years together, only 4.5 of which have lived together.  Knows the stress of facing this confrontation is taking a toll on her energy.  Discussed and clarified her true obligations.  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  Discussed and provided clarifying concepts  and considerations both from business and her faith for overcoming what seem to be manipulative statements from husband, who is said to often neutralize her complaints by claiming God's will or call, by citing her own depression history, or rushing to characterize his own behavior as his own depression but in the past.  Reaffirmed her right to be authentic and to refuse to underwrite falsehoods, even in service of church-building.  Provided biblical basis for assertiveness and subordinating loyalty to marriage and family ties to paramount values and the wishes of a benevolent God as expressed in Jesus' life and teachings.  Therapeutic modalities: Cognitive Behavioral Therapy, Solution-Oriented/Positive Psychology and Faith-sensitive  Mental Status/Observations:  Appearance:   Casual and Neat     Behavior:  Appropriate  Motor:  Normal  Speech/Language:   Clear and Coherent  Affect:  Appropriate  Mood:  anxious  Thought process:  normal  Thought content:    WNL  Sensory/Perceptual disturbances:    WNL  Orientation:  Fully oriented  Attention:  Good    Concentration:  Good  Memory:  WNL  Insight:    Good  Judgment:   Good  Impulse Control:  Good   Risk Assessment: Danger to Self: No Self-injurious Behavior: No Danger to Others: No Physical Aggression / Violence: No Duty to Warn: No Access to Firearms a concern: No  Assessment of progress:  progressing well  Diagnosis:   ICD-10-CM   1. GAD (generalized anxiety disorder)  F41.1   2. 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 Relationship problem between partners  Z63.0  4. Spiritual problem  Z65.8    Plan:   Consider further what her responses will be to lobbying and resistance from husband in delivering the news that she wants out of the marriage.  Consider further responses to job situation and how to frame her requests if negotiating for improved pay and responsibilities in  current job.  Maintain perspective that she is choosing between two goods, it is OK to pursue both, and it is worth both the practice and the opportunity presenting her case for a raise.  Other recommendations/advice as may be noted above  Continue to utilize previously learned skills ad lib  Maintain medication as prescribed and work faithfully with relevant prescriber(s) if any changes are desired or seem indicated  Call the clinic on-call service, present to ER, or call 911 if any life-threatening psychiatric crisis Return 2-6 weeks, for time as available.  Already scheduled visit in this office Visit date not found.  Robley Fries, PhD [name], PhD LP Clinical Psychologist, [facility] [address] [place], [place] [address] [phone]

## 2020-05-22 ENCOUNTER — Encounter: Payer: Self-pay | Admitting: Primary Care

## 2020-05-22 ENCOUNTER — Ambulatory Visit (INDEPENDENT_AMBULATORY_CARE_PROVIDER_SITE_OTHER): Payer: Managed Care, Other (non HMO) | Admitting: Primary Care

## 2020-05-22 ENCOUNTER — Other Ambulatory Visit: Payer: Self-pay

## 2020-05-22 VITALS — BP 124/78 | HR 76 | Temp 98.6°F | Ht 62.5 in | Wt 176.4 lb

## 2020-05-22 DIAGNOSIS — M546 Pain in thoracic spine: Secondary | ICD-10-CM

## 2020-05-22 MED ORDER — CYCLOBENZAPRINE HCL 5 MG PO TABS: 2.5000 mg | ORAL_TABLET | Freq: Three times a day (TID) | ORAL | 0 refills | Status: DC | PRN

## 2020-05-22 NOTE — Patient Instructions (Signed)
You may take the cyclobenzaprine 5 mg every 8 hours as needed for muscle spasms.   You can continue the diclofenac twice daily with food if needed for pain.   You must work on stretching your muscles to reduce tightness.   Please update me if no significant improvement in 2 weeks.  It was a pleasure to see you today!
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
## 2020-06-25 ENCOUNTER — Telehealth: Payer: Self-pay | Admitting: Primary Care

## 2020-06-25 NOTE — Telephone Encounter (Signed)
Given to [employer]

## 2020-06-25 NOTE — Telephone Encounter (Signed)
Received FMLA for patient. Called to get clarification. LVM to call back.

## 2020-06-26 NOTE — Telephone Encounter (Signed)
Paperwork faxed. Patient notified.   Copy for scan Copy for patient. 

## 2020-09-15 ENCOUNTER — Other Ambulatory Visit: Payer: Self-pay | Admitting: Primary Care

## 2020-09-15 DIAGNOSIS — F411 Generalized anxiety disorder: Secondary | ICD-10-CM

## 2020-10-09 ENCOUNTER — Encounter: Payer: Managed Care, Other (non HMO) | Admitting: Primary Care

## 2020-10-16 ENCOUNTER — Ambulatory Visit (INDEPENDENT_AMBULATORY_CARE_PROVIDER_SITE_OTHER): Payer: No Typology Code available for payment source | Admitting: Primary Care

## 2020-10-16 ENCOUNTER — Other Ambulatory Visit: Payer: Self-pay

## 2020-10-16 ENCOUNTER — Encounter: Payer: Self-pay | Admitting: Primary Care

## 2020-10-16 VITALS — BP 116/74 | HR 86 | Temp 98.4°F | Ht 62.5 in | Wt 167.0 lb

## 2020-10-16 DIAGNOSIS — Z0000 Encounter for general adult medical examination without abnormal findings: Secondary | ICD-10-CM | POA: Diagnosis not present

## 2020-10-16 DIAGNOSIS — Z1159 Encounter for screening for other viral diseases: Secondary | ICD-10-CM | POA: Diagnosis not present

## 2020-10-16 DIAGNOSIS — E785 Hyperlipidemia, unspecified: Secondary | ICD-10-CM

## 2020-10-16 DIAGNOSIS — F411 Generalized anxiety disorder: Secondary | ICD-10-CM | POA: Diagnosis not present

## 2020-10-16 DIAGNOSIS — Z1231 Encounter for screening mammogram for malignant neoplasm of breast: Secondary | ICD-10-CM

## 2020-10-16 DIAGNOSIS — Z114 Encounter for screening for human immunodeficiency virus [HIV]: Secondary | ICD-10-CM

## 2020-10-16 LAB — LIPID PANEL
Cholesterol: 235 mg/dL — ABNORMAL HIGH (ref 0–200)
HDL: 64.3 mg/dL (ref >39.00)
LDL Cholesterol: 155 mg/dL — ABNORMAL HIGH (ref 0–99)
NonHDL: 170.32
Total CHOL/HDL Ratio: 4
Triglycerides: 79 mg/dL (ref 0.0–149.0)
VLDL: 15.8 mg/dL (ref 0.0–40.0)

## 2020-10-16 LAB — CBC
HCT: 39.7 % (ref 36.0–46.0)
Hemoglobin: 13.6 g/dL (ref 12.0–15.0)
MCHC: 34.3 g/dL (ref 30.0–36.0)
MCV: 88.4 fl (ref 78.0–100.0)
Platelets: 372 10*3/uL (ref 150.0–400.0)
RBC: 4.49 Mil/uL (ref 3.87–5.11)
RDW: 13.5 % (ref 11.5–15.5)
WBC: 5.1 10*3/uL (ref 4.0–10.5)

## 2020-10-16 LAB — COMPREHENSIVE METABOLIC PANEL
ALT: 15 U/L (ref 0–35)
AST: 17 U/L (ref 0–37)
Albumin: 4.4 g/dL (ref 3.5–5.2)
Alkaline Phosphatase: 53 U/L (ref 39–117)
BUN: 13 mg/dL (ref 6–23)
CO2: 27 mEq/L (ref 19–32)
Calcium: 9.7 mg/dL (ref 8.4–10.5)
Chloride: 104 mEq/L (ref 96–112)
Creatinine, Ser: 0.82 mg/dL (ref 0.40–1.20)
GFR: 87.47 mL/min (ref >60.00)
Glucose, Bld: 87 mg/dL (ref 70–99)
Potassium: 4.4 mEq/L (ref 3.5–5.1)
Sodium: 141 mEq/L (ref 135–145)
Total Bilirubin: 0.5 mg/dL (ref 0.2–1.2)
Total Protein: 7.3 g/dL (ref 6.0–8.3)

## 2020-10-16 LAB — TSH: TSH: 0.74 u[IU]/mL (ref 0.35–5.50)

## 2020-10-16 NOTE — Assessment & Plan Note (Signed)
Commended her on weight loss!  Repeat lipid panel pending.  
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
## 2020-10-16 NOTE — Assessment & Plan Note (Signed)
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  Will wean her off venlafaxine. She's already failed 2 prior SSRI's.  Referral placed to psychiatry and therapy.  She agrees.

## 2020-10-16 NOTE — Assessment & Plan Note (Signed)
Tetanus UTD. Pap smear UTD, due next year. Mammogram overdue, orders placed.  Commended her on weight loss! Exam today as noted. Labs pending.

## 2020-10-16 NOTE — Patient Instructions (Addendum)
Stop by the lab prior to leaving today. I will notify you of your results once received.   You will be contacted regarding your referral to therapy and psychiatry.  Please let us know if you have not been contacted within two weeks.   [facility].   Take the venlafaxine every other day for 10 days, then every 3 days for 2 weeks then stop.  It was a pleasure to see you today!  Preventive Care 60-44 Years Old, Female Preventive care refers to lifestyle choices and visits with your health care provider that can promote health and wellness. This includes: A yearly physical exam. This is also called an annual wellness visit. Regular dental and eye exams. Immunizations. Screening for certain conditions. Healthy lifestyle choices, such as: Eating a healthy diet. Getting regular exercise. Not using drugs or products that contain nicotine and tobacco. Limiting alcohol use. What can I expect for my preventive care visit? Physical exam Your health care provider will check your: Height and weight. These may be used to calculate your BMI (body mass index). BMI is a measurement that tells if you are at a healthy weight. Heart rate and blood pressure. Body temperature. Skin for abnormal spots. Counseling Your health care provider may ask you questions about your: Past medical problems. Family's medical history. Alcohol, tobacco, and drug use. Emotional well-being. Home life and relationship well-being. Sexual activity. Diet, exercise, and sleep habits. Work and work Statistician. Access to firearms. Method of birth control. Menstrual cycle. Pregnancy history. What immunizations do I need?  Vaccines are usually given at various ages, according to a schedule. Your health care provider will recommend vaccines for you based on your age, medicalhistory, and lifestyle or other factors, such as travel or where you work. What tests do I need? Blood  tests Lipid and cholesterol levels. These may be checked every 5 years, or more often if you are over 56 years old. Hepatitis C test. Hepatitis B test. Screening Lung cancer screening. You may have this screening every year starting at age 75 if you have a 30-pack-year history of smoking and currently smoke or have quit within the past 15 years. Colorectal cancer screening. All adults should have this screening starting at age 47 and continuing until age 37. Your health care provider may recommend screening at age 23 if you are at increased risk. You will have tests every 1-10 years, depending on your results and the type of screening test. Diabetes screening. This is done by checking your blood sugar (glucose) after you have not eaten for a while (fasting). You may have this done every 1-3 years. Mammogram. This may be done every 1-2 years. Talk with your health care provider about when you should start having regular mammograms. This may depend on whether you have a family history of breast cancer. BRCA-related cancer screening. This may be done if you have a family history of breast, ovarian, tubal, or peritoneal cancers. Pelvic exam and Pap test. This may be done every 3 years starting at age 40. Starting at age 75, this may be done every 5 years if you have a Pap test in combination with an HPV test. Other tests STD (sexually transmitted disease) testing, if you are at risk. Bone density scan. This is done to screen for osteoporosis. You may have this scan if you are at high risk for osteoporosis. Talk with your health care provider about your test results, treatment options,and if necessary, the  need for more tests. Follow these instructions at home: Eating and drinking  Eat a diet that includes fresh fruits and vegetables, whole grains, lean protein, and low-fat dairy products. Take vitamin and mineral supplements as recommended by your health care provider. Do not drink alcohol  if: Your health care provider tells you not to drink. You are pregnant, may be pregnant, or are planning to become pregnant. If you drink alcohol: Limit how much you have to 0-1 drink a day. Be aware of how much alcohol is in your drink. In the [place] one drink equals one 12 oz bottle of beer (355 mL), one 5 oz glass of wine (148 mL), or one 1 oz glass of hard liquor (44 mL).  Lifestyle Take daily care of your teeth and gums. Brush your teeth every morning and night with fluoride toothpaste. Floss one time each day. Stay active. Exercise for at least 30 minutes 5 or more days each week. Do not use any products that contain nicotine or tobacco, such as cigarettes, e-cigarettes, and chewing tobacco. If you need help quitting, ask your health care provider. Do not use drugs. If you are sexually active, practice safe sex. Use a condom or other form of protection to prevent STIs (sexually transmitted infections). If you do not wish to become pregnant, use a form of birth control. If you plan to become pregnant, see your health care provider for a prepregnancy visit. If told by your health care provider, take low-dose aspirin daily starting at age 39. Find healthy ways to cope with stress, such as: Meditation, yoga, or listening to music. Journaling. Talking to a trusted person. Spending time with friends and family. Safety Always wear your seat belt while driving or riding in a vehicle. Do not drive: If you have been drinking alcohol. Do not ride with someone who has been drinking. When you are tired or distracted. While texting. Wear a helmet and other protective equipment during sports activities. If you have firearms in your house, make sure you follow all gun safety procedures. What's next? Visit your health care provider once a year for an annual wellness visit. Ask your health care provider how often you should have your eyes and teeth checked. Stay up to date on all vaccines. This  information is not intended to replace advice given to you by your health care provider. Make sure you discuss any questions you have with your healthcare provider. Document Revised: 01/08/2020 Document Reviewed: 12/15/2017 Elsevier Patient Education  2022 [employer].

## 2020-10-17 LAB — HEPATITIS C ANTIBODY
Hepatitis C Ab: NONREACTIVE
SIGNAL TO CUT-OFF: 0.01 (ref <1.00)

## 2020-10-17 LAB — HIV ANTIBODY (ROUTINE TESTING W REFLEX): HIV 1&2 Ab, 4th Generation: NONREACTIVE

## 2020-10-29 ENCOUNTER — Ambulatory Visit
Admission: RE | Admit: 2020-10-29 | Discharge: 2020-10-29 | Disposition: A | Discharge status: Home / Self Care | Payer: No Typology Code available for payment source | Attending: Primary Care | Admitting: Primary Care

## 2020-10-29 ENCOUNTER — Ambulatory Visit (INDEPENDENT_AMBULATORY_CARE_PROVIDER_SITE_OTHER): Payer: No Typology Code available for payment source | Admitting: Primary Care

## 2020-10-29 ENCOUNTER — Encounter: Payer: Self-pay | Admitting: Primary Care

## 2020-10-29 ENCOUNTER — Ambulatory Visit
Admission: RE | Admit: 2020-10-29 | Discharge: 2020-10-29 | Disposition: A | Discharge status: Home / Self Care | Payer: No Typology Code available for payment source | Source: Ambulatory Visit | Attending: Primary Care | Admitting: Primary Care

## 2020-10-29 ENCOUNTER — Other Ambulatory Visit: Payer: Self-pay

## 2020-10-29 DIAGNOSIS — R109 Unspecified abdominal pain: Secondary | ICD-10-CM

## 2020-10-29 DIAGNOSIS — R1010 Upper abdominal pain, unspecified: Secondary | ICD-10-CM

## 2020-10-29 HISTORY — DX: Unspecified abdominal pain: R10.9

## 2020-10-29 LAB — BASIC METABOLIC PANEL
BUN: 10 mg/dL (ref 6–23)
CO2: 28 mEq/L (ref 19–32)
Calcium: 9.4 mg/dL (ref 8.4–10.5)
Chloride: 105 mEq/L (ref 96–112)
Creatinine, Ser: 0.84 mg/dL (ref 0.40–1.20)
GFR: 84.96 mL/min (ref >60.00)
Glucose, Bld: 98 mg/dL (ref 70–99)
Potassium: 4.8 mEq/L (ref 3.5–5.1)
Sodium: 138 mEq/L (ref 135–145)

## 2020-10-29 LAB — CBC WITH DIFFERENTIAL/PLATELET
Basophils Absolute: 0 10*3/uL (ref 0.0–0.1)
Basophils Relative: 0.7 % (ref 0.0–3.0)
Eosinophils Absolute: 0.1 10*3/uL (ref 0.0–0.7)
Eosinophils Relative: 2.2 % (ref 0.0–5.0)
HCT: 39.8 % (ref 36.0–46.0)
Hemoglobin: 13.7 g/dL (ref 12.0–15.0)
Lymphocytes Relative: 26.5 % (ref 12.0–46.0)
Lymphs Abs: 1.3 10*3/uL (ref 0.7–4.0)
MCHC: 34.4 g/dL (ref 30.0–36.0)
MCV: 89.4 fl (ref 78.0–100.0)
Monocytes Absolute: 0.5 10*3/uL (ref 0.1–1.0)
Monocytes Relative: 9.6 % (ref 3.0–12.0)
Neutro Abs: 2.9 10*3/uL (ref 1.4–7.7)
Neutrophils Relative %: 61 % (ref 43.0–77.0)
Platelets: 324 10*3/uL (ref 150.0–400.0)
RBC: 4.45 Mil/uL (ref 3.87–5.11)
RDW: 13.6 % (ref 11.5–15.5)
WBC: 4.8 10*3/uL (ref 4.0–10.5)

## 2020-10-29 LAB — LIPASE: Lipase: 43 U/L (ref 11.0–59.0)

## 2020-10-29 NOTE — Assessment & Plan Note (Addendum)
Mostly to epigastric and LUQ, acute for the last four days with nausea. Non tender to RUQ.  During her exam she experienced "sharp/stabbing" pain with guarding to LUQ. She is passing gas and moving bowels daily.   Checking abdominal plain films. Will also check H pylori stool test and CBC, BMP, lipase. Start PPI once daily as soon as stool has been collected.   ED precautions provided.
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
## 2020-10-29 NOTE — Addendum Note (Signed)
Addended by: Aquilla Solian on: 10/29/2020 04:38 PM   Modules accepted: Orders

## 2020-10-29 NOTE — Addendum Note (Signed)
Addended by: Alvina Chou on: 10/29/2020 10:13 AM   Modules accepted: Orders

## 2020-10-29 NOTE — Patient Instructions (Signed)
Stop by the lab prior to leaving today. I will notify you of your results once received.   Complete the stool test as soon as possible.   Go to the outpatient imaging center behind [facility] for the xray. No appointment needed.  Start omeprazole 20 mg once daily for stomach pain. DO NOT take this until you've completed the stool test.   It was a pleasure to see you today!

## 2020-10-30 LAB — HELICOBACTER PYLORI  SPECIAL ANTIGEN
MICRO NUMBER:: 12114441
SPECIMEN QUALITY: ADEQUATE

## 2020-11-24 ENCOUNTER — Telehealth: Payer: Self-pay | Admitting: Primary Care

## 2020-11-24 NOTE — Telephone Encounter (Signed)
Placed in your folder.

## 2020-11-24 NOTE — Telephone Encounter (Signed)
Patient came in requesting to has A Physical Result From filled out . It was place in folder

## 2020-11-25 NOTE — Telephone Encounter (Signed)
Completed and placed in Wendy Terry's inbox. 

## 2020-11-26 NOTE — Telephone Encounter (Signed)
Left message to return call to our office.  Need to have her come by and sign ppw so that it can be faxed or I can leave at resception and she can pick up and turn in.

## 2020-11-28 NOTE — Telephone Encounter (Signed)
See my chart message patient was informed of information and will come by office to pick up.

## 2020-12-02 ENCOUNTER — Telehealth: Payer: Self-pay | Admitting: Primary Care

## 2020-12-02 NOTE — Telephone Encounter (Signed)
Ppw faxed to number on form. No further action needed.

## 2020-12-02 NOTE — Telephone Encounter (Signed)
Pt came into office to sign documents. Place in provider box

## 2021-01-16 ENCOUNTER — Ambulatory Visit: Payer: No Typology Code available for payment source | Admitting: Primary Care

## 2021-02-17 ENCOUNTER — Other Ambulatory Visit: Payer: Self-pay | Admitting: Primary Care

## 2021-02-17 DIAGNOSIS — Z1231 Encounter for screening mammogram for malignant neoplasm of breast: Secondary | ICD-10-CM

## 2021-02-20 ENCOUNTER — Ambulatory Visit
Admission: RE | Admit: 2021-02-20 | Discharge: 2021-02-20 | Disposition: A | Discharge status: Home / Self Care | Payer: No Typology Code available for payment source | Source: Ambulatory Visit | Attending: Primary Care | Admitting: Primary Care

## 2021-02-20 ENCOUNTER — Other Ambulatory Visit: Payer: Self-pay

## 2021-02-20 DIAGNOSIS — Z1231 Encounter for screening mammogram for malignant neoplasm of breast: Secondary | ICD-10-CM

## 2021-02-24 ENCOUNTER — Other Ambulatory Visit: Payer: Self-pay | Admitting: Internal Medicine

## 2021-02-24 DIAGNOSIS — R928 Other abnormal and inconclusive findings on diagnostic imaging of breast: Secondary | ICD-10-CM

## 2021-03-19 ENCOUNTER — Ambulatory Visit
Admission: RE | Admit: 2021-03-19 | Discharge: 2021-03-19 | Disposition: A | Discharge status: Home / Self Care | Payer: No Typology Code available for payment source | Source: Ambulatory Visit | Attending: Internal Medicine | Admitting: Internal Medicine

## 2021-03-19 DIAGNOSIS — R928 Other abnormal and inconclusive findings on diagnostic imaging of breast: Secondary | ICD-10-CM

## 2021-04-23 LAB — RESULTS CONSOLE HPV: CHL HPV: NEGATIVE

## 2021-04-23 LAB — HM PAP SMEAR

## 2021-04-28 ENCOUNTER — Other Ambulatory Visit: Payer: Self-pay

## 2021-04-28 ENCOUNTER — Other Ambulatory Visit: Payer: Self-pay | Admitting: Family

## 2021-04-28 ENCOUNTER — Ambulatory Visit
Admission: RE | Admit: 2021-04-28 | Discharge: 2021-04-28 | Disposition: A | Discharge status: Home / Self Care | Payer: No Typology Code available for payment source | Source: Ambulatory Visit | Attending: Family | Admitting: Family

## 2021-04-28 ENCOUNTER — Telehealth: Payer: Self-pay

## 2021-04-28 ENCOUNTER — Ambulatory Visit: Payer: No Typology Code available for payment source | Admitting: Family

## 2021-04-28 VITALS — BP 112/80 | HR 82 | Temp 96.6°F | Ht 62.5 in | Wt 177.0 lb

## 2021-04-28 DIAGNOSIS — R42 Dizziness and giddiness: Secondary | ICD-10-CM

## 2021-04-28 DIAGNOSIS — N920 Excessive and frequent menstruation with regular cycle: Secondary | ICD-10-CM

## 2021-04-28 DIAGNOSIS — R5383 Other fatigue: Secondary | ICD-10-CM

## 2021-04-28 DIAGNOSIS — R1011 Right upper quadrant pain: Secondary | ICD-10-CM

## 2021-04-28 DIAGNOSIS — N946 Dysmenorrhea, unspecified: Secondary | ICD-10-CM

## 2021-04-28 DIAGNOSIS — N939 Abnormal uterine and vaginal bleeding, unspecified: Secondary | ICD-10-CM

## 2021-04-28 DIAGNOSIS — R350 Frequency of micturition: Secondary | ICD-10-CM

## 2021-04-28 LAB — CBC WITH DIFFERENTIAL/PLATELET
Basophils Absolute: 0 10*3/uL (ref 0.0–0.1)
Basophils Relative: 1 % (ref 0.0–3.0)
Eosinophils Absolute: 0.1 10*3/uL (ref 0.0–0.7)
Eosinophils Relative: 1.6 % (ref 0.0–5.0)
HCT: 41.7 % (ref 36.0–46.0)
Hemoglobin: 13.6 g/dL (ref 12.0–15.0)
Lymphocytes Relative: 28.6 % (ref 12.0–46.0)
Lymphs Abs: 1.2 10*3/uL (ref 0.7–4.0)
MCHC: 32.5 g/dL (ref 30.0–36.0)
MCV: 90.1 fl (ref 78.0–100.0)
Monocytes Absolute: 0.3 10*3/uL (ref 0.1–1.0)
Monocytes Relative: 8.1 % (ref 3.0–12.0)
Neutro Abs: 2.6 10*3/uL (ref 1.4–7.7)
Neutrophils Relative %: 60.7 % (ref 43.0–77.0)
Platelets: 340 10*3/uL (ref 150.0–400.0)
RBC: 4.63 Mil/uL (ref 3.87–5.11)
RDW: 13.4 % (ref 11.5–15.5)
WBC: 4.3 10*3/uL (ref 4.0–10.5)

## 2021-04-28 LAB — POCT URINALYSIS DIP (MANUAL ENTRY)
Bilirubin, UA: NEGATIVE
Glucose, UA: NEGATIVE mg/dL
Ketones, POC UA: NEGATIVE mg/dL
Nitrite, UA: NEGATIVE
Protein Ur, POC: NEGATIVE mg/dL
Spec Grav, UA: 1.015 (ref 1.010–1.025)
Urobilinogen, UA: 0.2 E.U./dL
pH, UA: 6 (ref 5.0–8.0)

## 2021-04-28 LAB — FOLLICLE STIMULATING HORMONE: FSH: 7.9 m[IU]/mL

## 2021-04-28 LAB — TSH: TSH: 1.46 u[IU]/mL (ref 0.35–5.50)

## 2021-04-28 LAB — PROLACTIN: Prolactin: 52.3 ng/mL — ABNORMAL HIGH

## 2021-04-28 LAB — POCT URINE PREGNANCY: Preg Test, Ur: NEGATIVE

## 2021-04-28 LAB — TESTOSTERONE: Testosterone: 40.57 ng/dL — ABNORMAL HIGH (ref 15.00–40.00)

## 2021-04-28 LAB — T3, FREE: T3, Free: 3.5 pg/mL (ref 2.3–4.2)

## 2021-04-28 LAB — T4, FREE: Free T4: 0.92 ng/dL (ref 0.60–1.60)

## 2021-04-28 MED ORDER — CIPROFLOXACIN HCL 500 MG PO TABS: 500.0000 mg | ORAL_TABLET | Freq: Two times a day (BID) | ORAL | 0 refills | Status: DC

## 2021-04-28 NOTE — Telephone Encounter (Signed)
Per appt notes pt already has appt scheduled with Mort Sawyers FNP 04/28/21 at 11:40. Sending note to T. Dugal FNP, Dustin CMA and Allayne Gitelman NP FYI as PCP.

## 2021-04-28 NOTE — Telephone Encounter (Signed)
Morrie Sheldon from Ultrasound calls in to report:  Abdomen - Normal  Pelvic - Increased vascularity of the uterine myometrium, can be seen in the setting of adenomyosis  Forwarding to ordering provider, Mort Sawyers, NP and flagging now for her.

## 2021-04-28 NOTE — Patient Instructions (Signed)
Please go now to [facility], go now to [facility], check in at Radiology at 2:15, NPO until after her appt (not even water).   Once results received I will be in touch with you.  If for any reason your abdominal pain worsens or increases please immediately go the ER.   Stop by the lab prior to leaving today. I will notify you of your results once received.  Urine also tested today sending for culture.   It was a pleasure seeing you today! Please do not hesitate to reach out with any questions and or concerns.  Regards,   Eugenia Pancoast FNP-C

## 2021-04-28 NOTE — Progress Notes (Signed)
Have also called and left detailed VM on pt's machine to let her know of results.
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
## 2021-04-28 NOTE — Telephone Encounter (Signed)
[facility] Night - Client TELEPHONE ADVICE RECORD [facility] Patient Name: Wendy Terry Gender: Female DOB: 1976/01/16 Age: 46 Y 2 M 3 D Return Phone Number: [phone] (Primary) Address[place]  [address] Client [facility] Night - Client Client Site [place] Provider Alma Friendly - NP Contact Type Call Who Is Calling Patient / Member / Family / Caregiver Call Type Triage / Clinical Caller Name [facility] Basilio Relationship To Patient Self Return Phone Number [phone] (Primary) Chief Complaint SEVERE ABDOMINAL PAIN - Severe pain in abdomen Reason for Call Symptomatic / Request for [place] states she has had dizziness, heavy period bleeding , severe abdominal cramps, no energy Translation No Nurse Assessment Nurse: Thad Ranger, RN, Langley Gauss Date/Time (Eastern Time): 04/28/2021 8:11:21 AM Confirm and document reason for call. If symptomatic, describe symptoms. ---Caller states she has had dizziness, heavy period bleeding , severe abdominal cramps, no energy. Denies heavy bleeding today. Does the patient have any new or worsening symptoms? ---Yes Will a triage be completed? ---Yes Related visit to physician within the last 2 weeks? ---No Does the PT have any chronic conditions? (i.e. diabetes, asthma, this includes High risk factors for pregnancy, etc.) ---No Is the patient pregnant or possibly pregnant? (Ask all females between the ages of 75-55) ---No Is this a behavioral health or substance abuse call? ---No Guidelines Guideline Title Affirmed Question Affirmed Notes Nurse Date/Time (Eastern Time) Abdominal Pain - Menstrual Cramps [1] SEVERE pain (e.g., excruciating cramps) AND [2] worse than ever before AND [3] not improved with ibuprofen or naproxen Carmon, RN, Langley Gauss 04/28/2021 8:13:43 AM PLEASE NOTE: All  timestamps contained within this report are represented as [place] Standard Time. CONFIDENTIALTY NOTICE: This fax transmission is intended only for the addressee. It contains information that is legally privileged, confidential or otherwise protected from use or disclosure. If you are not the intended recipient, you are strictly prohibited from reviewing, disclosing, copying using or disseminating any of this information or taking any action in reliance on or regarding this information. If you have received this fax in error, please notify us immediately by telephone so that we can arrange for its return to [place]. Phone: [phone], Toll-Free: [phone], Fax: [phone] Page: 2 of 2 Call Id: [id number] [place]. Time Eilene Ghazi Time) Disposition Final User 04/28/2021 8:06:27 AM Send to Urgent Queue Vinnie Level 04/28/2021 8:15:22 AM See HCP within 4 Hours (or PCP triage) Yes Carmon, RN, Yevette Edwards Disagree/Comply Comply Caller Understands Yes PreDisposition Call Doctor Care Advice Given Per Guideline SEE HCP (OR PCP TRIAGE) WITHIN 4 HOURS: PAIN MEDICINES: * IBUPROFEN (E.G., MOTRIN, ADVIL): Take 400 mg (two 200 mg pills) by mouth every 6 hours. The most you should take is 6 pills a day (1,200 mg total). CALL BACK IF: * You become worse CARE ADVICE given per Abdominal Pain, Menstrual Cramps (Adult) guideline Referrals REFERRED TO PCP OFFIC

## 2021-04-29 ENCOUNTER — Encounter: Payer: Self-pay | Admitting: *Deleted

## 2021-04-29 ENCOUNTER — Telehealth: Payer: Self-pay

## 2021-04-29 ENCOUNTER — Other Ambulatory Visit: Payer: Self-pay | Admitting: Family

## 2021-04-29 DIAGNOSIS — R350 Frequency of micturition: Secondary | ICD-10-CM

## 2021-04-29 DIAGNOSIS — R1011 Right upper quadrant pain: Secondary | ICD-10-CM | POA: Insufficient documentation

## 2021-04-29 DIAGNOSIS — R42 Dizziness and giddiness: Secondary | ICD-10-CM | POA: Insufficient documentation

## 2021-04-29 DIAGNOSIS — E281 Androgen excess: Secondary | ICD-10-CM

## 2021-04-29 DIAGNOSIS — E221 Hyperprolactinemia: Secondary | ICD-10-CM | POA: Insufficient documentation

## 2021-04-29 DIAGNOSIS — N946 Dysmenorrhea, unspecified: Secondary | ICD-10-CM | POA: Insufficient documentation

## 2021-04-29 DIAGNOSIS — N920 Excessive and frequent menstruation with regular cycle: Secondary | ICD-10-CM | POA: Insufficient documentation

## 2021-04-29 HISTORY — DX: Right upper quadrant pain: R10.11

## 2021-04-29 HISTORY — DX: Hyperprolactinemia: E22.1

## 2021-04-29 HISTORY — DX: Frequency of micturition: R35.0

## 2021-04-29 HISTORY — DX: Dizziness and giddiness: R42

## 2021-04-29 LAB — URINE CULTURE
MICRO NUMBER:: 12850921
Result:: NO GROWTH
SPECIMEN QUALITY:: ADEQUATE

## 2021-04-29 NOTE — Assessment & Plan Note (Signed)
Work-up in process pending results

## 2021-04-29 NOTE — Assessment & Plan Note (Signed)
Stat transvaginal ultrasound ordered pending results.

## 2021-04-29 NOTE — Assessment & Plan Note (Signed)
Urine ordered today pending results dipstick urine does suggest trace leukocytes we will choose to treat with ciprofloxacin 500 mg twice daily for 3 days as patient with suprapubic pain as well as increased urinary frequency.  Pending urine culture as well as any need to change medication or stop treatment

## 2021-04-29 NOTE — Assessment & Plan Note (Signed)
Hormonal work-up in place to include prolactin testosterone and thyroid levels as well as FSH and luteinizing hormone.  Pending results also ordered transvaginal ultrasound and patient is on her way to [facility] after this visit to go as needed stat.

## 2021-04-29 NOTE — Assessment & Plan Note (Signed)
Thyroid and hormonal work-up in progress.  Pending results

## 2021-04-29 NOTE — Telephone Encounter (Signed)
[facility] referring for Eval/treat Increased vascularity of the uterine myometrium on u/s pelvis/transvag, Painful and heavy menses, Pelvic pain. Urgent . MD only. Called and left voicemail for patient to call back to be scheduled.

## 2021-04-29 NOTE — Assessment & Plan Note (Signed)
On Physical exam patient with significant abdominal pain and stat ultrasound of the abdomen ordered as patient with Eulah Pont sign pending results.  Advised patient if any worsening pain and swelling is now to immediately go to the emergency room and or call 911.

## 2021-04-29 NOTE — Assessment & Plan Note (Signed)
Thyroid and prolactin ordered pending results.  Patient denies chest pain at current.  Patient also with a lot of pain and increased anxiety so discussed anxiety reducing techniques.

## 2021-04-30 ENCOUNTER — Telehealth: Payer: Self-pay | Admitting: Family

## 2021-04-30 NOTE — Telephone Encounter (Signed)
Called and left voicemail for patient to call back to be scheduled. 

## 2021-04-30 NOTE — Telephone Encounter (Signed)
Erroneous encounter

## 2021-05-06 NOTE — Telephone Encounter (Signed)
Patient is scheduled for 05/20/21 with [facility]

## 2021-05-12 ENCOUNTER — Ambulatory Visit: Payer: No Typology Code available for payment source | Admitting: Primary Care

## 2021-05-12 ENCOUNTER — Encounter: Payer: Self-pay | Admitting: *Deleted

## 2021-05-20 ENCOUNTER — Ambulatory Visit
Admission: RE | Admit: 2021-05-20 | Discharge: 2021-05-20 | Disposition: A | Discharge status: Home / Self Care | Payer: No Typology Code available for payment source | Source: Ambulatory Visit | Attending: Family | Admitting: Family

## 2021-05-20 ENCOUNTER — Other Ambulatory Visit: Payer: Self-pay

## 2021-05-20 ENCOUNTER — Ambulatory Visit: Payer: No Typology Code available for payment source | Admitting: Obstetrics and Gynecology

## 2021-05-20 ENCOUNTER — Encounter: Payer: Self-pay | Admitting: Obstetrics and Gynecology

## 2021-05-20 VITALS — BP 120/80 | Ht 62.5 in | Wt 172.0 lb

## 2021-05-20 DIAGNOSIS — Z124 Encounter for screening for malignant neoplasm of cervix: Secondary | ICD-10-CM

## 2021-05-20 DIAGNOSIS — N939 Abnormal uterine and vaginal bleeding, unspecified: Secondary | ICD-10-CM | POA: Diagnosis not present

## 2021-05-20 DIAGNOSIS — E221 Hyperprolactinemia: Secondary | ICD-10-CM | POA: Diagnosis present

## 2021-05-20 MED ORDER — GADOBUTROL 1 MMOL/ML IV SOLN
8.0000 mL | Freq: Once | INTRAVENOUS | Status: AC | PRN
  Administered 2021-05-20: 8 mL via INTRAVENOUS

## 2021-05-20 NOTE — Patient Instructions (Signed)
Menorrhagia Menorrhagia is a form of abnormal uterine bleeding in which menstrual periods are heavy or last longer than normal. With menorrhagia, the periods may cause enough blood loss and cramping that a woman becomes unable to take part in herusual activities. What are the causes? Common causes of this condition include: Polyps or fibroids. These are noncancerous growths in the uterus. An imbalance of the hormones estrogen and progesterone. Anovulation, which occurs when one of the ovaries does not release an egg during one or more months. A problem with the thyroid gland (hypothyroidism). Side effects of having an intrauterine device (IUD). Side effects of some medicines, such as NSAIDs or blood thinners. A bleeding disorder that stops the blood from clotting normally. In some cases, the cause of this condition is not known. What increases the risk? You are more likely to develop this condition if you have cancer of the uterus. What are the signs or symptoms? Symptoms of this condition include: Routinely having to change your pad or tampon every 1-2 hours because it is soaked. Needing to use pads and tampons at the same time because of heavy bleeding. Needing to wake up to change your pads or tampons during the night. Passing blood clots larger than 1 inch (2.5 cm) in size. Having bleeding that lasts for more than 7 days. Having symptoms of low iron levels (anemia), such as tiredness (fatigue) or shortness of breath. How is this diagnosed? This condition may be diagnosed based on: A physical exam. Your symptoms and menstrual history. Tests, such as: Blood tests to check if you are pregnant or if you have hormonal changes, a bleeding or thyroid disorder, anemia, or other problems. Pap test to check for cancerous changes, infections, or inflammation. Endometrial biopsy. This test involves removing a tissue sample from the lining of the uterus (endometrium) to be examined under a  microscope. Pelvic ultrasound. This test uses sound waves to create images of your uterus, ovaries, and vagina. The images can show if you have fibroids or other growths. Hysteroscopy. For this test, a thin, flexible tube with a light on the end (hysteroscope) is used to look inside your uterus. How is this treated? Treatment may not be needed for this condition. If it is needed, the best treatment for you will depend on: Whether you need to prevent pregnancy. Your desire to have children in the future. The cause and severity of your bleeding. Your personal preference. Medicine Medicines are the first step in treatment. You may be treated with: Hormonal birth control methods. These treatments reduce bleeding during your menstrual period. They include: Birth control pills. Skin patch. Vaginal ring. Shots (injections) that you get every 3 months. Hormonal IUD. Implants that go under the skin. Medicines that thicken the blood and slow bleeding. Medicines that reduce swelling, such as ibuprofen. Medicines that contain an artificial (synthetic) hormone called progestin. Medicines that make the ovaries stop working for a short time. Iron supplements to treat anemia.  Surgery If medicines do not work, surgery may be done. Surgical options may include: Dilation and curettage (D&C). In this procedure, your health care provider opens the lowest part of the uterus (cervix) and then scrapes or suctions tissue from the endometrium. This reduces menstrual bleeding. Operative hysteroscopy. In this procedure, a hysteroscope is used to view your uterus and help remove polyps that may be causing heavy periods. Endometrial ablation. This is when various techniques are used to permanently destroy your entire endometrium. After endometrial ablation, most women have little or   no menstrual flow. This procedure reduces your ability to become pregnant. Endometrial resection. In this procedure, an electrosurgical  wire loop is used to remove the endometrium. This procedure reduces your ability to become pregnant. Hysterectomy. This is surgical removal of your uterus. This is a permanent procedure that stops menstrual periods. Pregnancy is not possible after a hysterectomy. Follow these instructions at home: Medicines Take over-the-counter and prescription medicines only as told by your health care provider. This includes iron pills. Do not change or switch medicines without asking your health care provider. Do not take aspirin or medicines that contain aspirin 1 week before or during your menstrual period. Aspirin may make bleeding worse. Managing constipation Your iron pills may cause constipation. If you are taking prescription iron supplements, you may need to take these actions to prevent or treat constipation: Drink enough fluid to keep your urine pale yellow. Take over-the-counter or prescription medicines. Eat foods that are high in fiber, such as beans, whole grains, and fresh fruits and vegetables. Limit foods that are high in fat and processed sugars, such as fried or sweet foods. General instructions If you need to change your sanitary pad or tampon more than once every 2 hours, limit your activity until the bleeding stops. Eat well-balanced meals, including foods that are high in iron. Foods that have a lot of iron include leafy green vegetables, meat, liver, eggs, and whole-grain breads and cereals. Do not try to lose weight until the abnormal bleeding has stopped and your blood iron level is back to normal. If you need to lose weight, work with your health care provider to lose weight safely. Keep all follow-up visits. This is important. Contact a health care provider if: You soak through a pad or tampon every 1 or 2 hours, and this happens every time you have a period. You need to use pads and tampons at the same time because you are bleeding so much. You have nausea, vomiting, diarrhea, or  other problems related to medicines you are taking. Get help right away if: You soak through more than a pad or tampon in 1 hour. You pass clots bigger than 1 inch (2.5 cm) wide. You feel short of breath. You feel like your heart is beating too fast. You feel dizzy or you faint. You feel very weak or tired. Summary Menorrhagia is a form of abnormal uterine bleeding in which menstrual periods are heavy or last longer than normal. Treatment may not be needed for this condition. If it is needed, it may include medicines or procedures. Take over-the-counter and prescription medicines only as told by your health care provider. This includes iron pills. Get help right away if you have heavy bleeding that soaks through more than a pad or tampon in 1 hour, you pass large clots, or you feel dizzy, short of breath, or very weak or tired. This information is not intended to replace advice given to you by your health care provider. Make sure you discuss any questions you have with your healthcare provider. Document Revised: 12/18/2019 Document Reviewed: 12/18/2019 Elsevier Patient Education  2022 [employer]  
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
## 2021-06-08 ENCOUNTER — Encounter: Payer: Self-pay | Admitting: Primary Care

## 2021-06-08 ENCOUNTER — Ambulatory Visit: Payer: No Typology Code available for payment source | Admitting: Primary Care

## 2021-06-08 ENCOUNTER — Other Ambulatory Visit: Payer: Self-pay

## 2021-06-08 VITALS — BP 124/68 | HR 81 | Temp 98.6°F | Ht 62.5 in | Wt 171.0 lb

## 2021-06-08 DIAGNOSIS — E221 Hyperprolactinemia: Secondary | ICD-10-CM | POA: Diagnosis not present

## 2021-06-08 DIAGNOSIS — N939 Abnormal uterine and vaginal bleeding, unspecified: Secondary | ICD-10-CM | POA: Diagnosis not present

## 2021-06-08 DIAGNOSIS — F411 Generalized anxiety disorder: Secondary | ICD-10-CM | POA: Diagnosis not present

## 2021-06-08 MED ORDER — CITALOPRAM HYDROBROMIDE 20 MG PO TABS: 20.0000 mg | ORAL_TABLET | Freq: Every day | ORAL | 0 refills | Status: DC

## 2021-06-08 NOTE — Assessment & Plan Note (Signed)
Unclear etiology, MRI brain reviewed and is normal.  Repeat prolactin level pending.
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
## 2021-06-08 NOTE — Patient Instructions (Signed)
Start citalopram (Celexa) 20 mg for anxiety and depression. Take 1/2 tablet by mouth once daily for about one week, then increase to 1 full tablet thereafter.   Call Dr. Waylan Boga office and also the therapist's office to reschedule an appointment.   Stop by the lab prior to leaving today. I will notify you of your results once received.   It was a pleasure to see you today!

## 2021-06-08 NOTE — Assessment & Plan Note (Signed)
Reviewed office notes from GYN from February 2023.  Referral placed for new GYN to discuss ongoing management of menorrhagia and dysmenorrhea.

## 2021-06-08 NOTE — Assessment & Plan Note (Signed)
Deteriorated.  Long discussion regarding the need for her to move forward with mental health help.   She will call psychiatry and theraot to reschedule her appointments.    She agrees to try medication again. Rx for citalopram 20 mg sent to pharmacy.  Patient is to take 1/2 tablet daily for 8 days, then advance to 1 full tablet thereafter. We discussed possible side effects of headache, GI upset, drowsiness, and SI/HI. If thoughts of SI/HI develop, we discussed to present to the emergency immediately. Patient verbalized understanding.   Follow up in 6 weeks for re-evaluation.

## 2021-06-09 LAB — PROLACTIN: Prolactin: 12.5 ng/mL

## 2021-06-12 ENCOUNTER — Encounter: Payer: Self-pay | Admitting: *Deleted

## 2021-07-07 NOTE — Telephone Encounter (Signed)
No message left

## 2021-07-21 ENCOUNTER — Ambulatory Visit: Payer: No Typology Code available for payment source | Admitting: Primary Care

## 2021-07-22 LAB — OB RESULTS CONSOLE GC/CHLAMYDIA: Chlamydia: NEGATIVE

## 2021-07-23 LAB — TSH: TSH: 1.3 (ref <=5.90)

## 2021-09-05 ENCOUNTER — Other Ambulatory Visit: Payer: Self-pay | Admitting: Primary Care

## 2021-09-05 DIAGNOSIS — F411 Generalized anxiety disorder: Secondary | ICD-10-CM

## 2021-09-15 IMAGING — MG MM DIGITAL DIAGNOSTIC UNILAT*L* W/ TOMO W/ CAD
6 series · 6 of 18 positions shown · non-contrast
Comparison: Previous exam(s).
COMPARISON: Previous exam(s).

Addendum:
CLINICAL DATA: 43-year-old female with a palpable left breast lump,
which has improved over the last several days.

EXAM:
DIGITAL DIAGNOSTIC LEFT MAMMOGRAM WITH CAD AND TOMO
ULTRASOUND LEFT BREAST

[L CC synth-2D]
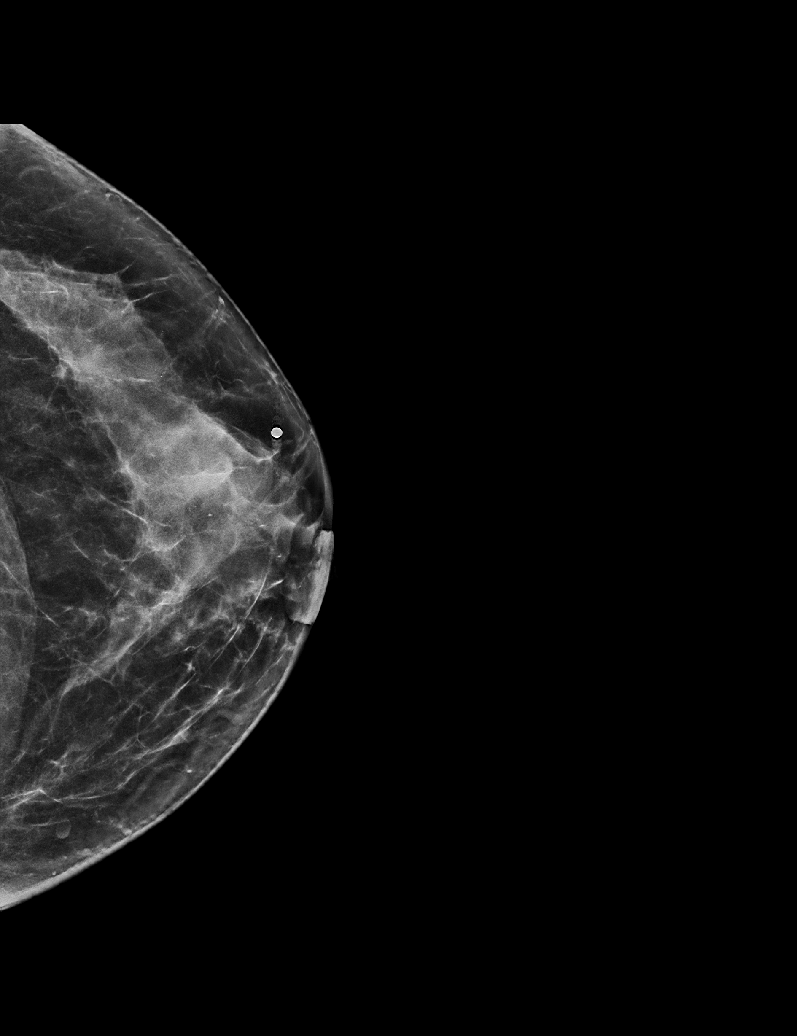

[L MLO synth-2D]
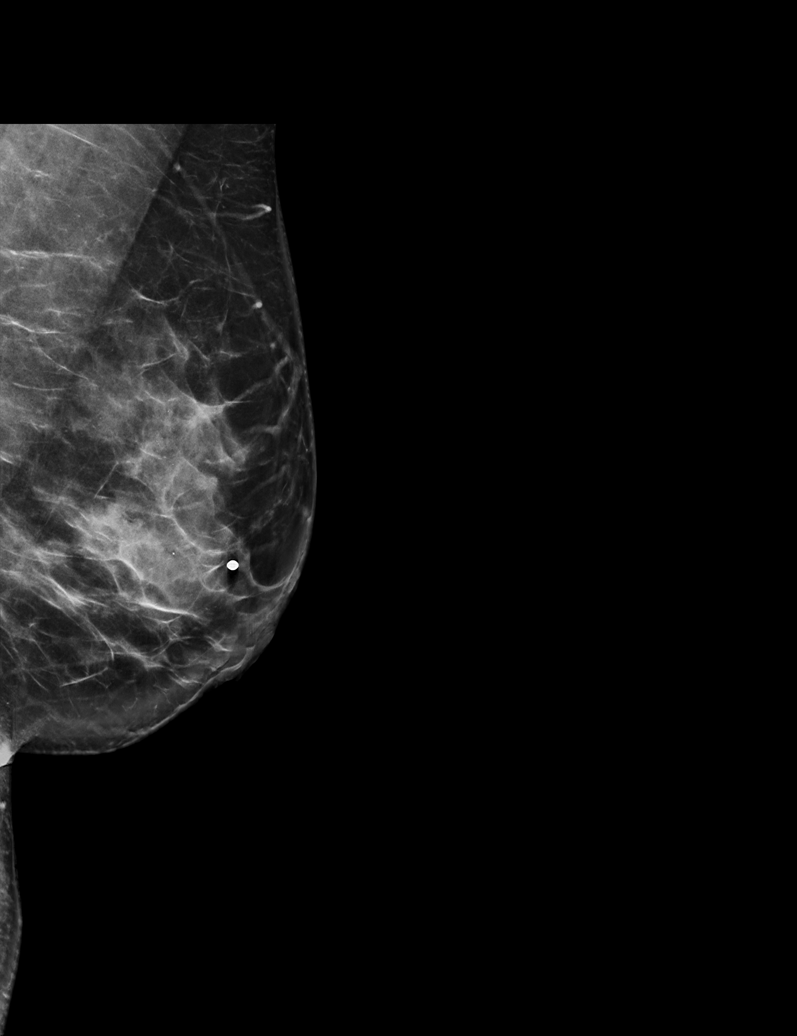

[L TAN synth-2D]
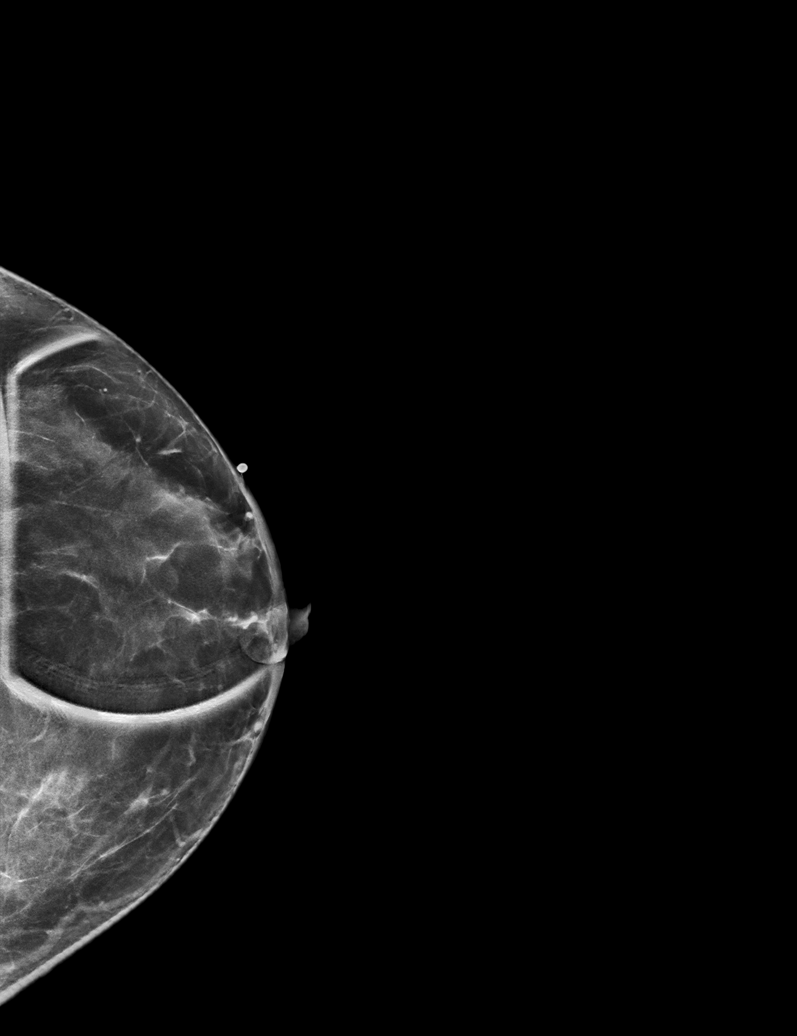

[L TAN tomo · tomo slice 28/55.0]
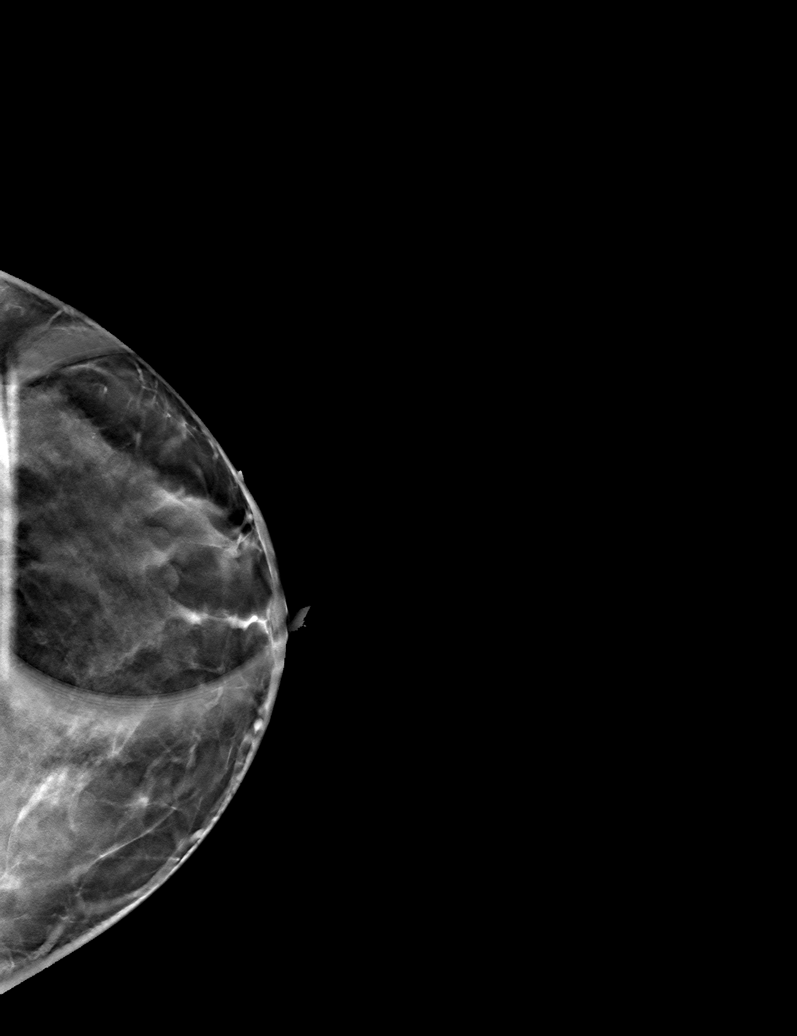

[L CC tomo · tomo slice 31/60.0]
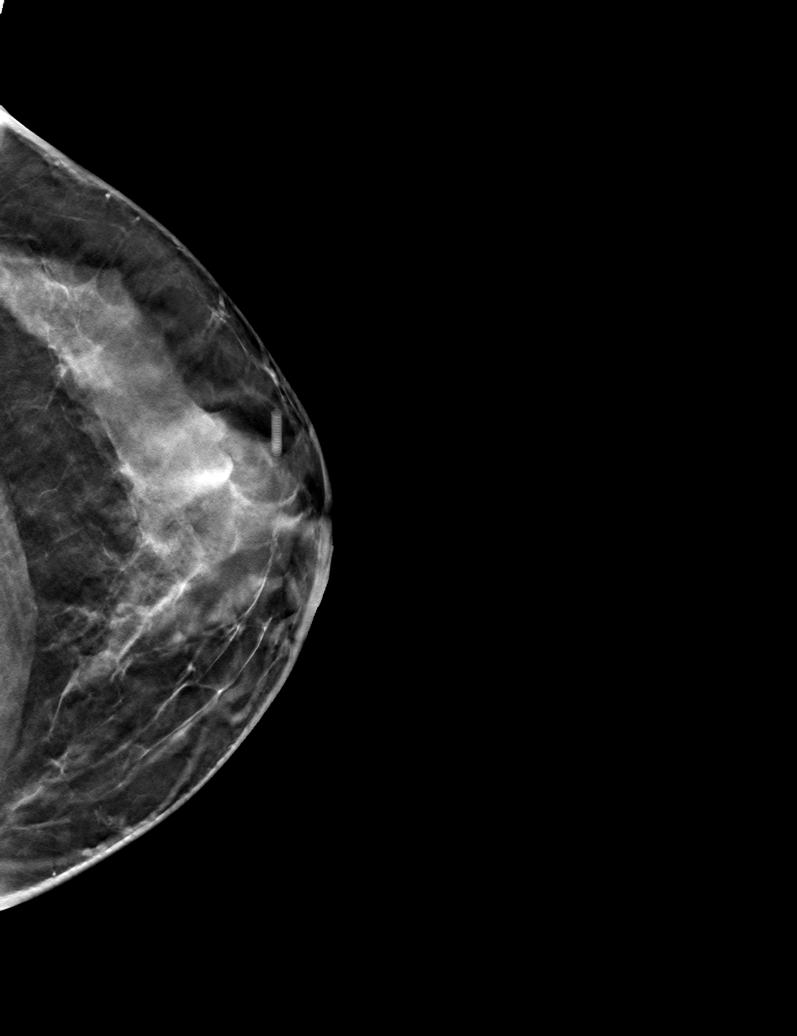

[L MLO tomo · tomo slice 39/78.0]
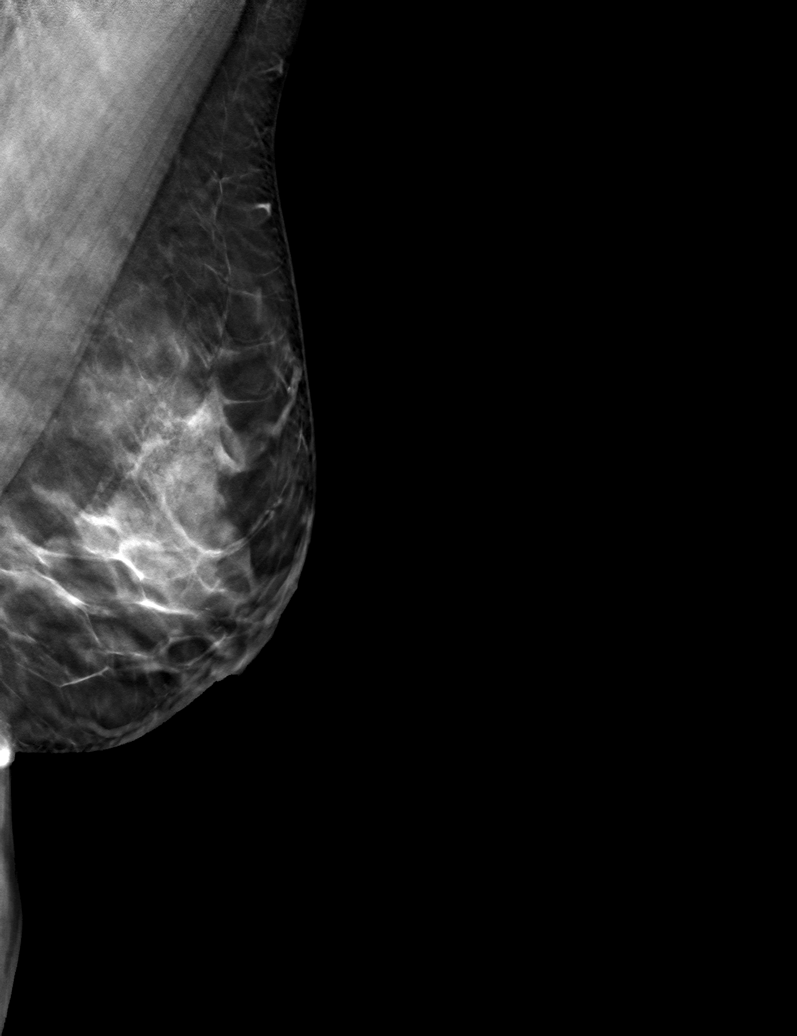

[6 of 18 positions shown; findings below may reference images not displayed]

ACR Breast Density Category c: The breast tissue is heterogeneously
dense, which may obscure small masses.
FINDINGS: A radiopaque BB was placed at the site of the patient's palpable
lump in the upper-outer left breast. A partially circumscribed,
partially obscured oval mass is seen deep to the radiopaque BB.
Further evaluation with ultrasound was performed.

Mammographic images were processed with CAD.

Targeted ultrasound is performed, showing an oval, circumscribed
anechoic mass at the 3 o'clock position 3 cm from the nipple. It
measures 1.4 x 1.3 x 1.1 cm. There is no internal vascularity.
IMPRESSION: Benign simple cyst corresponding with the patient's left breast
palpable lump. No further imaging follow-up required.

RECOMMENDATION:
Screening mammogram in one year.(Code:ZS-V-VU3)

I have discussed the findings and recommendations with the patient.
If applicable, a reminder letter will be sent to the patient
regarding the next appointment.

BI-RADS CATEGORY  2: Benign.

ADDENDUM:
RECOMMENDATION:

Annual bilateral screening mammogram in March 2020.

*** End of Addendum ***
ACR Breast Density Category c: The breast tissue is heterogeneously
dense, which may obscure small masses.
FINDINGS: A radiopaque BB was placed at the site of the patient's palpable
lump in the upper-outer left breast. A partially circumscribed,
partially obscured oval mass is seen deep to the radiopaque BB.
Further evaluation with ultrasound was performed.

Mammographic images were processed with CAD.

Targeted ultrasound is performed, showing an oval, circumscribed
anechoic mass at the 3 o'clock position 3 cm from the nipple. It
measures 1.4 x 1.3 x 1.1 cm. There is no internal vascularity.
IMPRESSION: Benign simple cyst corresponding with the patient's left breast
palpable lump. No further imaging follow-up required.

RECOMMENDATION:
Screening mammogram in one year.(Code:[id number]-V-VU3)

I have discussed the findings and recommendations with the patient.
If applicable, a reminder letter will be sent to the patient
regarding the next appointment.

BI-RADS CATEGORY  2: Benign.

## 2021-10-21 ENCOUNTER — Ambulatory Visit (INDEPENDENT_AMBULATORY_CARE_PROVIDER_SITE_OTHER): Payer: No Typology Code available for payment source | Admitting: Primary Care

## 2021-10-21 ENCOUNTER — Encounter: Payer: Self-pay | Admitting: Primary Care

## 2021-10-21 VITALS — BP 124/76 | HR 81 | Temp 98.6°F | Ht 62.5 in | Wt 175.0 lb

## 2021-10-21 DIAGNOSIS — E785 Hyperlipidemia, unspecified: Secondary | ICD-10-CM

## 2021-10-21 DIAGNOSIS — Z0000 Encounter for general adult medical examination without abnormal findings: Secondary | ICD-10-CM | POA: Diagnosis not present

## 2021-10-21 DIAGNOSIS — F411 Generalized anxiety disorder: Secondary | ICD-10-CM

## 2021-10-21 DIAGNOSIS — E221 Hyperprolactinemia: Secondary | ICD-10-CM

## 2021-10-21 DIAGNOSIS — N920 Excessive and frequent menstruation with regular cycle: Secondary | ICD-10-CM

## 2021-10-21 NOTE — Assessment & Plan Note (Signed)
Resolved. Normal prolactin level in February 2023

## 2021-10-21 NOTE — Patient Instructions (Signed)
It was a pleasure to see you today! ? ?Preventive Care 45-45 Years Old, Female ?Preventive care refers to lifestyle choices and visits with your health care provider that can promote health and wellness. Preventive care visits are also called wellness exams. ?What can I expect for my preventive care visit? ?Counseling ?Your health care provider may ask you questions about your: ?Medical history, including: ?Past medical problems. ?Family medical history. ?Pregnancy history. ?Current health, including: ?Menstrual cycle. ?Method of birth control. ?Emotional well-being. ?Home life and relationship well-being. ?Sexual activity and sexual health. ?Lifestyle, including: ?Alcohol, nicotine or tobacco, and drug use. ?Access to firearms. ?Diet, exercise, and sleep habits. ?Work and work environment. ?Sunscreen use. ?Safety issues such as seatbelt and bike helmet use. ?Physical exam ?Your health care provider will check your: ?Height and weight. These may be used to calculate your BMI (body mass index). BMI is a measurement that tells if you are at a healthy weight. ?Waist circumference. This measures the distance around your waistline. This measurement also tells if you are at a healthy weight and may help predict your risk of certain diseases, such as type 2 diabetes and high blood pressure. ?Heart rate and blood pressure. ?Body temperature. ?Skin for abnormal spots. ?What immunizations do I need? ? ?Vaccines are usually given at various ages, according to a schedule. Your health care provider will recommend vaccines for you based on your age, medical history, and lifestyle or other factors, such as travel or where you work. ?What tests do I need? ?Screening ?Your health care provider may recommend screening tests for certain conditions. This may include: ?Lipid and cholesterol levels. ?Diabetes screening. This is done by checking your blood sugar (glucose) after you have not eaten for a while (fasting). ?Pelvic exam and  Pap test. ?Hepatitis B test. ?Hepatitis C test. ?HIV (human immunodeficiency virus) test. ?STI (sexually transmitted infection) testing, if you are at risk. ?Lung cancer screening. ?Colorectal cancer screening. ?Mammogram. Talk with your health care provider about when you should start having regular mammograms. This may depend on whether you have a family history of breast cancer. ?BRCA-related cancer screening. This may be done if you have a family history of breast, ovarian, tubal, or peritoneal cancers. ?Bone density scan. This is done to screen for osteoporosis. ?Talk with your health care provider about your test results, treatment options, and if necessary, the need for more tests. ?Follow these instructions at home: ?Eating and drinking ? ?Eat a diet that includes fresh fruits and vegetables, whole grains, lean protein, and low-fat dairy products. ?Take vitamin and mineral supplements as recommended by your health care provider. ?Do not drink alcohol if: ?Your health care provider tells you not to drink. ?You are pregnant, may be pregnant, or are planning to become pregnant. ?If you drink alcohol: ?Limit how much you have to 0-1 drink a day. ?Know how much alcohol is in your drink. In the [place] one drink equals one 12 oz bottle of beer (355 mL), one 5 oz glass of wine (148 mL), or one 1? oz glass of hard liquor (44 mL). ?Lifestyle ?Brush your teeth every morning and night with fluoride toothpaste. Floss one time each day. ?Exercise for at least 30 minutes 5 or more days each week. ?Do not use any products that contain nicotine or tobacco. These products include cigarettes, chewing tobacco, and vaping devices, such as e-cigarettes. If you need help quitting, ask your health care provider. ?Do not use drugs. ?If you are sexually active, practice safe sex.   Use a condom or other form of protection to prevent STIs. If you do not wish to become pregnant, use a form of birth control. If you plan to become  pregnant, see your health care provider for a prepregnancy visit. Take aspirin only as told by your health care provider. Make sure that you understand how much to take and what form to take. Work with your health care provider to find out whether it is safe and beneficial for you to take aspirin daily. Find healthy ways to manage stress, such as: Meditation, yoga, or listening to music. Journaling. Talking to a trusted person. Spending time with friends and family. Minimize exposure to UV radiation to reduce your risk of skin cancer. Safety Always wear your seat belt while driving or riding in a vehicle. Do not drive: If you have been drinking alcohol. Do not ride with someone who has been drinking. When you are tired or distracted. While texting. If you have been using any mind-altering substances or drugs. Wear a helmet and other protective equipment during sports activities. If you have firearms in your house, make sure you follow all gun safety procedures. Seek help if you have been physically or sexually abused. What's next? Visit your health care provider once a year for an annual wellness visit. Ask your health care provider how often you should have your eyes and teeth checked. Stay up to date on all vaccines. This information is not intended to replace advice given to you by your health care provider. Make sure you discuss any questions you have with your health care provider. Document Revised: 10/01/2020 Document Reviewed: 10/01/2020 Elsevier Patient Education  [place].
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
## 2021-10-21 NOTE — Assessment & Plan Note (Addendum)
Controlled.   Continue citalopram 20 mg daily.   She will start seeing a therapist in [place].

## 2021-10-21 NOTE — Assessment & Plan Note (Addendum)
Discussed the importance of a healthy diet and regular exercise in order for weight loss, and to reduce the risk of further co-morbidity.  Patient will share her recent labs from her employer.

## 2021-10-21 NOTE — Assessment & Plan Note (Signed)
Immunizations UTD.  Discussed the importance of a healthy diet and regular exercise in order for weight loss, and to reduce the risk of further co-morbidity.  Exam stable. She will forward her labs from her employer.   Follow up in 1 year for repeat physical.

## 2021-10-21 NOTE — Assessment & Plan Note (Signed)
Following with GYN. Recent removal of fibroids and polyps.

## 2021-10-22 LAB — HEMOGLOBIN A1C: Hemoglobin A1C: 5.4

## 2021-10-22 LAB — BASIC METABOLIC PANEL: Glucose: 110

## 2021-10-22 LAB — LIPID PANEL
Cholesterol: 192 (ref 0–200)
HDL: 62 (ref 35–70)
LDL Cholesterol: 113
LDl/HDL Ratio: 3
Triglycerides: 66 (ref 40–160)

## 2021-10-23 NOTE — Telephone Encounter (Signed)
Wendy Terry, is there any way to abstract these into the lab section of her chart?

## 2021-10-29 ENCOUNTER — Encounter: Payer: Self-pay | Admitting: Primary Care

## 2021-10-30 NOTE — Telephone Encounter (Signed)
Labs have been abstracted and copy sent to scan

## 2021-11-07 ENCOUNTER — Other Ambulatory Visit: Payer: Self-pay

## 2021-11-07 ENCOUNTER — Emergency Department
Admission: EM | Admit: 2021-11-07 | Discharge: 2021-11-07 | Discharge status: Against Medical Advice | Payer: No Typology Code available for payment source | Attending: Emergency Medicine | Admitting: Emergency Medicine

## 2021-11-07 ENCOUNTER — Emergency Department: Payer: No Typology Code available for payment source

## 2021-11-07 DIAGNOSIS — F419 Anxiety disorder, unspecified: Secondary | ICD-10-CM | POA: Diagnosis not present

## 2021-11-07 DIAGNOSIS — R079 Chest pain, unspecified: Secondary | ICD-10-CM | POA: Diagnosis present

## 2021-11-07 DIAGNOSIS — Z5329 Procedure and treatment not carried out because of patient's decision for other reasons: Secondary | ICD-10-CM | POA: Diagnosis not present

## 2021-11-07 MED ORDER — LORAZEPAM 2 MG/ML IJ SOLN
1.0000 mg | Freq: Once | INTRAMUSCULAR | Status: AC
  Administered 2021-11-07: 1 mg via INTRAVENOUS
  Filled 2021-11-07: qty 1

## 2021-11-07 MED ORDER — SODIUM CHLORIDE 0.9 % IV BOLUS
1000.0000 mL | Freq: Once | INTRAVENOUS | Status: AC
  Administered 2021-11-07: 1000 mL via INTRAVENOUS

## 2021-11-07 NOTE — ED Triage Notes (Signed)
Pt presents to ED via [facility] with c/o CP and also appears to be having an anxiety attack, pt educated on breathing exercises. Pt is A&Ox4. VSS

## 2021-11-07 NOTE — Discharge Instructions (Addendum)
Please have your regular doctor check your EKG.  That we had a short PR interval show appear.  This is not really something to worry about but occasionally will give you a rapid heartbeat.  Your doctor should be able to check this without any difficulty.  Otherwise he or she can send you to cardiology if he needs to.

## 2021-11-07 NOTE — ED Provider Notes (Signed)
[facility] Provider Note    Event Date/Time   First MD Initiated Contact with Patient 11/07/21 1411     (approximate)   History   Chest pain   HPI  Wendy Terry is a 46 y.o. female who reports about an hour prior to arrival she began having sharp stabbing chest pain.  Is now become more of a dull ache.  She has a history of panic attacks but none for many years.  She is very anxious now.  She denies any smoking does not have any other medical problems.      Physical Exam   Triage Vital Signs: ED Triage Vitals  Enc Vitals Group     BP      Pulse      Resp      Temp      Temp src      SpO2      Weight      Height      Head Circumference      Peak Flow      Pain Score      Pain Loc      Pain Edu?      Excl. in GC?     Most recent vital signs: Vitals:   11/07/21 1457  BP: 120/86  Pulse: (!) 105  Resp: (!) 21  Temp: 98.6 F (37 C)  SpO2: 99%     General: Awake, alert very anxious CV:  Good peripheral perfusion.  Heart regular rate and rhythm no audible murmurs Resp:  Normal effort.  Lungs are clear Abd:  No distention.  Soft and nontender Extremities with no edema   ED Results / Procedures / Treatments   Labs (all labs ordered are listed, but only abnormal results are displayed) Labs Reviewed  CBC WITH DIFFERENTIAL/PLATELET  D-DIMER, QUANTITATIVE  COMPREHENSIVE METABOLIC PANEL  PREGNANCY, URINE  TROPONIN I (HIGH SENSITIVITY)  TROPONIN I (HIGH SENSITIVITY)     EKG  EMS EKG shows EKG rate of about 130.  Normal axis nonspecific ST-T wave changes possibly rate related Monitor now showing heart rate of 108 EKG in the ER read and interpreted by me shows normal sinus rhythm rate of 77 normal axis normal EKG except for short PR interval of 110 ms  RADIOLOGY  Chest x-ray read by radiology reviewed and interpreted by me as negative  PROCEDURES:  Critical Care performed:   Procedures   MEDICATIONS ORDERED IN  ED: Medications  LORazepam (ATIVAN) injection 1 mg (1 mg Intravenous Given 11/07/21 1536)  sodium chloride 0.9 % bolus 1,000 mL (1,000 mLs Intravenous New Bag/Given 11/07/21 1534)     IMPRESSION / MDM / ASSESSMENT AND PLAN / ED COURSE  I reviewed the triage vital signs and the nursing notes. Patient initially with bed chest pain seems better now anxiety is also seeming better now.  Because of her age and the minimal changes on the initial EKG I will get serial troponins and make sure everything looks good.  Labs are still not back yet 4:10 PM I am signing the patient out to oncoming physician.  Differential diagnosis includes, but is not limited to, panic attack, SVT, angina, PE, reflux, esophageal spasm etc.  Patient's presentation is most consistent with acute complicated illness / injury requiring diagnostic workup.  The patient is on the cardiac monitor to evaluate for evidence of arrhythmia and/or significant heart rate changes.  So far none have been seen      FINAL  CLINICAL IMPRESSION(S) / ED DIAGNOSES   Final diagnoses:  Chest pain, unspecified type     Rx / DC Orders   ED Discharge Orders     None        Note:  This document was prepared using Dragon voice recognition software and may include unintentional dictation errors.   Arnaldo Natal, MD 11/07/21 [phone]

## 2021-12-04 ENCOUNTER — Other Ambulatory Visit: Payer: Self-pay | Admitting: Primary Care

## 2021-12-04 DIAGNOSIS — F411 Generalized anxiety disorder: Secondary | ICD-10-CM

## 2022-01-12 ENCOUNTER — Ambulatory Visit: Payer: No Typology Code available for payment source | Admitting: Internal Medicine

## 2022-02-03 ENCOUNTER — Other Ambulatory Visit: Payer: Self-pay | Admitting: Primary Care

## 2022-02-03 DIAGNOSIS — Z1231 Encounter for screening mammogram for malignant neoplasm of breast: Secondary | ICD-10-CM

## 2022-02-09 ENCOUNTER — Ambulatory Visit
Admission: EM | Admit: 2022-02-09 | Discharge: 2022-02-09 | Disposition: A | Discharge status: Home / Self Care | Payer: No Typology Code available for payment source | Attending: Urgent Care | Admitting: Urgent Care

## 2022-02-09 ENCOUNTER — Emergency Department
Admission: EM | Admit: 2022-02-09 | Discharge: 2022-02-09 | Discharge status: Against Medical Advice | Payer: No Typology Code available for payment source

## 2022-02-09 DIAGNOSIS — J019 Acute sinusitis, unspecified: Secondary | ICD-10-CM | POA: Diagnosis present

## 2022-02-09 DIAGNOSIS — J069 Acute upper respiratory infection, unspecified: Secondary | ICD-10-CM | POA: Diagnosis present

## 2022-02-09 DIAGNOSIS — U071 COVID-19: Secondary | ICD-10-CM | POA: Diagnosis not present

## 2022-02-09 DIAGNOSIS — R6889 Other general symptoms and signs: Secondary | ICD-10-CM

## 2022-02-09 LAB — RESP PANEL BY RT-PCR (RSV, FLU A&B, COVID)  RVPGX2
Influenza A by PCR: NEGATIVE
Influenza B by PCR: NEGATIVE
Resp Syncytial Virus by PCR: NEGATIVE
SARS Coronavirus 2 by RT PCR: POSITIVE — AB

## 2022-02-09 MED ORDER — AZITHROMYCIN 250 MG PO TABS: ORAL_TABLET | ORAL | 0 refills | Status: DC

## 2022-02-09 NOTE — ED Notes (Signed)
No answer when called several times from lobby 

## 2022-02-09 NOTE — Discharge Instructions (Addendum)
Recommend use of Sudafed (pseudoephedrine) for relief of your nasal/sinus congestion.  You may need to ask the pharmacist for this medication though it is over-the-counter.  I have prescribed azithromycin (Z-Pak) at your request though this antibiotic will not treat what is a likely viral infection.     The results of your respiratory swab will be available in your MyChart account.  If positive, you will be contacted by telephone tomorrow to discuss treatment options.  If you see a positive result and do not receive a call, you may contact the urgent care clinic.

## 2022-02-09 NOTE — ED Triage Notes (Signed)
Pt. Presents to UC w/ c/o a cough, nasal congestion, body aches, a headache and a stomachache that started yesterday. Pt. Has been treating herself w/ OTC medication.
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
## 2022-03-24 ENCOUNTER — Ambulatory Visit
Admission: RE | Admit: 2022-03-24 | Discharge: 2022-03-24 | Disposition: A | Discharge status: Home / Self Care | Payer: No Typology Code available for payment source | Source: Ambulatory Visit | Attending: Primary Care | Admitting: Primary Care

## 2022-03-24 DIAGNOSIS — Z1231 Encounter for screening mammogram for malignant neoplasm of breast: Secondary | ICD-10-CM

## 2022-08-23 ENCOUNTER — Telehealth: Payer: Self-pay | Admitting: Primary Care

## 2022-08-23 NOTE — Telephone Encounter (Signed)
Patient dropped off document  dental clearance , to be filled out by provider. Patient requested to send it via Fax within 2-days. Document is located in providers tray at front office.Please advise at Mobile [phone] (mobile)

## 2022-08-24 NOTE — Telephone Encounter (Signed)
Form has been faxed to number listed at bottom of page.

## 2022-08-24 NOTE — Telephone Encounter (Signed)
Placed in [facility] box for review and completion 

## 2022-08-24 NOTE — Telephone Encounter (Signed)
Completed and placed in Kelli's inbox for faxing.

## 2023-01-14 ENCOUNTER — Other Ambulatory Visit: Payer: No Typology Code available for payment source

## 2023-06-13 ENCOUNTER — Ambulatory Visit
Admission: EM | Admit: 2023-06-13 | Discharge: 2023-06-13 | Disposition: A | Discharge status: Home / Self Care | Payer: Self-pay | Attending: Emergency Medicine | Admitting: Emergency Medicine

## 2023-06-13 ENCOUNTER — Encounter: Payer: Self-pay | Admitting: Emergency Medicine

## 2023-06-13 ENCOUNTER — Other Ambulatory Visit: Payer: Self-pay

## 2023-06-13 DIAGNOSIS — S46811A Strain of other muscles, fascia and tendons at shoulder and upper arm level, right arm, initial encounter: Secondary | ICD-10-CM

## 2023-06-13 MED ORDER — KETOROLAC TROMETHAMINE 30 MG/ML IJ SOLN
30.0000 mg | Freq: Once | INTRAMUSCULAR | Status: AC
  Administered 2023-06-13: 30 mg via INTRAMUSCULAR

## 2023-06-13 MED ORDER — PREDNISONE 10 MG (21) PO TBPK: ORAL_TABLET | Freq: Every day | ORAL | 0 refills | Status: DC

## 2023-06-13 MED ORDER — DEXAMETHASONE SODIUM PHOSPHATE 10 MG/ML IJ SOLN
10.0000 mg | Freq: Once | INTRAMUSCULAR | Status: AC
  Administered 2023-06-13: 10 mg via INTRAMUSCULAR

## 2023-06-13 MED ORDER — CYCLOBENZAPRINE HCL 10 MG PO TABS: 10.0000 mg | ORAL_TABLET | Freq: Two times a day (BID) | ORAL | 0 refills | Status: DC | PRN

## 2023-06-13 MED ORDER — OXYCODONE HCL 5 MG PO CAPS: 5.0000 mg | ORAL_CAPSULE | Freq: Four times a day (QID) | ORAL | 0 refills | Status: AC | PRN

## 2023-06-13 NOTE — Discharge Instructions (Signed)
 Your pain is most likely caused by irritation to the muscles.  You have been given an injection of Decadron and Toradol to help reduce inflammation and help with pain and ideally will see relief within 1 hour  starting tomorrow take prednisone every morning with food, may take Tylenol or use any topical medicines in addition to this  May use muscle relaxants twice daily for additional comfort, be mindful this can make you feel sleepy  May use every 6 hours for severe pain, be mindful this can make you feel drowsy  You may use heating pad in 15 minute intervals as needed for additional comfort, within the first 2-3 days you may find comfort in using ice in 10-15 minutes over affected area  Begin massaging and stretching affected area daily for 10 minutes as tolerated to further loosen muscles   When sitting and  lying down place pillow underneath back and between knees for support  Can try sleeping without pillow on firm mattress   Practice good posture: head back, shoulders back, chest forward, pelvis back and weight distributed evenly on both legs  If pain persist after recommended treatment or reoccurs if may be beneficial to follow up with orthopedic specialist for evaluation, this doctor specializes in the bones and can manage your symptoms long-term with options such as but not limited to imaging, medications or physical therapy
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
## 2023-06-13 NOTE — ED Triage Notes (Addendum)
 Patient presents to [facility] for evaluation of right scapular pain starting 3 days ago, lots of coughing and sneezing that day.  Patient rotated her mattress the next day and was already having mild discomfort to the area . Overnight that night she began to have worsening pain.  Took home gabapentin, ibuprofen, baclofen, and methcarbamel.  All this morning at 4am, has not touched it.  Patient has a history of pleurisy and this feels [facility] but similar.

## 2023-08-09 ENCOUNTER — Encounter: Payer: Self-pay | Admitting: Emergency Medicine

## 2023-08-09 ENCOUNTER — Ambulatory Visit
Admission: EM | Admit: 2023-08-09 | Discharge: 2023-08-09 | Disposition: A | Discharge status: Home / Self Care | Payer: Self-pay | Attending: Emergency Medicine | Admitting: Emergency Medicine

## 2023-08-09 ENCOUNTER — Other Ambulatory Visit: Payer: Self-pay

## 2023-08-09 DIAGNOSIS — J069 Acute upper respiratory infection, unspecified: Secondary | ICD-10-CM

## 2023-08-09 MED ORDER — PROMETHAZINE-DM 6.25-15 MG/5ML PO SYRP: 5.0000 mL | ORAL_SOLUTION | Freq: Every evening | ORAL | 0 refills | Status: AC | PRN

## 2023-08-09 MED ORDER — BENZONATATE 100 MG PO CAPS: 100.0000 mg | ORAL_CAPSULE | Freq: Three times a day (TID) | ORAL | 0 refills | Status: AC

## 2023-08-09 MED ORDER — ALBUTEROL SULFATE HFA 108 (90 BASE) MCG/ACT IN AERS: 2.0000 | INHALATION_SPRAY | Freq: Four times a day (QID) | RESPIRATORY_TRACT | 2 refills | Status: AC | PRN

## 2023-08-09 MED ORDER — AMOXICILLIN 500 MG PO CAPS: 500.0000 mg | ORAL_CAPSULE | Freq: Two times a day (BID) | ORAL | 0 refills | Status: AC

## 2023-08-09 NOTE — ED Triage Notes (Signed)
 Patient presents to [facility] for evaluation of 6 days of cough and cold with low grade fever, body aches and mild sore throat.  The sore throat has worsened in the past 2 days along with green mucous coming up when she coughs.  Feels like it has moved into her chest and nasal congestion was severe this morning.

## 2023-08-09 NOTE — Discharge Instructions (Signed)
 Take amoxicillin  every morning and every evening for 7 days  May use Tessalon  pill every 8 hours as needed for cough, may use cough syrup at bedtime as needed for comfort  May use inhaler taking 2 puffs every 6 hours as needed for chest tightness  You can take Tylenol  and/or Ibuprofen  as needed for fever reduction and pain relief.   For cough: honey 1/2 to 1 teaspoon (you can dilute the honey in water or another fluid).  You can also use guaifenesin and dextromethorphan for cough. You can use a humidifier for chest congestion and cough.  If you don't have a humidifier, you can sit in the bathroom with the hot shower running.      For sore throat: try warm salt water gargles, cepacol lozenges, throat spray, warm tea or water with lemon/honey, popsicles or ice, or OTC cold relief medicine for throat discomfort.   For congestion: take a daily anti-histamine like Zyrtec, Claritin, and a oral decongestant, such as pseudoephedrine.  You can also use Flonase 1-2 sprays in each nostril daily.   It is important to stay hydrated: drink plenty of fluids (water, gatorade/powerade/pedialyte, juices, or teas) to keep your throat moisturized and help further relieve irritation/discomfort.
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
## 2023-12-22 ENCOUNTER — Emergency Department (HOSPITAL_COMMUNITY)
Admission: EM | Admit: 2023-12-22 | Discharge: 2023-12-22 | Disposition: A | Discharge status: Home / Self Care | Payer: Self-pay | Attending: Emergency Medicine | Admitting: Emergency Medicine

## 2023-12-22 ENCOUNTER — Encounter (HOSPITAL_COMMUNITY): Payer: Self-pay

## 2023-12-22 ENCOUNTER — Other Ambulatory Visit: Payer: Self-pay

## 2023-12-22 ENCOUNTER — Emergency Department (HOSPITAL_COMMUNITY): Payer: Self-pay

## 2023-12-22 DIAGNOSIS — R079 Chest pain, unspecified: Secondary | ICD-10-CM | POA: Insufficient documentation

## 2023-12-22 DIAGNOSIS — R0682 Tachypnea, not elsewhere classified: Secondary | ICD-10-CM | POA: Insufficient documentation

## 2023-12-22 LAB — I-STAT CHEM 8, ED
BUN: 12 mg/dL (ref 6–20)
Calcium, Ion: 1.07 mmol/L — ABNORMAL LOW (ref 1.15–1.40)
Chloride: 105 mmol/L (ref 98–111)
Creatinine, Ser: 0.8 mg/dL (ref 0.44–1.00)
Glucose, Bld: 106 mg/dL — ABNORMAL HIGH (ref 70–99)
HCT: 40 % (ref 36.0–46.0)
Hemoglobin: 13.6 g/dL (ref 12.0–15.0)
Potassium: 3.6 mmol/L (ref 3.5–5.1)
Sodium: 137 mmol/L (ref 135–145)
TCO2: 19 mmol/L — ABNORMAL LOW (ref 22–32)

## 2023-12-22 LAB — BASIC METABOLIC PANEL WITH GFR
Anion gap: 14 (ref 5–15)
BUN: 11 mg/dL (ref 6–20)
CO2: 18 mmol/L — ABNORMAL LOW (ref 22–32)
Calcium: 9.4 mg/dL (ref 8.9–10.3)
Chloride: 104 mmol/L (ref 98–111)
Creatinine, Ser: 0.83 mg/dL (ref 0.44–1.00)
GFR, Estimated: >60 mL/min (ref >60)
Glucose, Bld: 104 mg/dL — ABNORMAL HIGH (ref 70–99)
Potassium: 3.6 mmol/L (ref 3.5–5.1)
Sodium: 136 mmol/L (ref 135–145)

## 2023-12-22 LAB — CBC WITH DIFFERENTIAL/PLATELET
Abs Immature Granulocytes: 0.01 K/uL (ref 0.00–0.07)
Basophils Absolute: 0 K/uL (ref 0.0–0.1)
Basophils Relative: 1 %
Eosinophils Absolute: 0.7 K/uL — ABNORMAL HIGH (ref 0.0–0.5)
Eosinophils Relative: 9 %
HCT: 40.9 % (ref 36.0–46.0)
Hemoglobin: 13.4 g/dL (ref 12.0–15.0)
Immature Granulocytes: 0 %
Lymphocytes Relative: 30 %
Lymphs Abs: 2.5 K/uL (ref 0.7–4.0)
MCH: 29.1 pg (ref 26.0–34.0)
MCHC: 32.8 g/dL (ref 30.0–36.0)
MCV: 88.9 fL (ref 80.0–100.0)
Monocytes Absolute: 0.6 K/uL (ref 0.1–1.0)
Monocytes Relative: 8 %
Neutro Abs: 4.3 K/uL (ref 1.7–7.7)
Neutrophils Relative %: 52 %
Platelets: 443 K/uL — ABNORMAL HIGH (ref 150–400)
RBC: 4.6 MIL/uL (ref 3.87–5.11)
RDW: 13.2 % (ref 11.5–15.5)
WBC: 8.2 K/uL (ref 4.0–10.5)
nRBC: 0 % (ref 0.0–0.2)

## 2023-12-22 LAB — D-DIMER, QUANTITATIVE: D-Dimer, Quant: <0.27 ug{FEU}/mL (ref 0.00–0.50)

## 2023-12-22 LAB — TROPONIN I (HIGH SENSITIVITY)
Troponin I (High Sensitivity): 4 ng/L (ref <18)
Troponin I (High Sensitivity): 6 ng/L (ref <18)

## 2023-12-22 MED ORDER — DIAZEPAM 5 MG PO TABS
5.0000 mg | ORAL_TABLET | Freq: Once | ORAL | Status: AC
  Administered 2023-12-22: 5 mg via ORAL
  Filled 2023-12-22: qty 1

## 2023-12-22 MED ORDER — ACETAMINOPHEN 500 MG PO TABS
1000.0000 mg | ORAL_TABLET | Freq: Once | ORAL | Status: AC
  Administered 2023-12-22: 1000 mg via ORAL
  Filled 2023-12-22: qty 2

## 2023-12-22 MED ORDER — MECLIZINE HCL 25 MG PO TABS: 25.0000 mg | ORAL_TABLET | Freq: Three times a day (TID) | ORAL | 0 refills | Status: AC | PRN

## 2023-12-22 MED ORDER — ONDANSETRON HCL 4 MG/2ML IJ SOLN
4.0000 mg | Freq: Once | INTRAMUSCULAR | Status: DC
  Filled 2023-12-22: qty 2

## 2023-12-22 MED ORDER — MORPHINE SULFATE (PF) 4 MG/ML IV SOLN
4.0000 mg | Freq: Once | INTRAVENOUS | Status: DC
  Filled 2023-12-22: qty 1

## 2023-12-22 MED ORDER — ONDANSETRON 4 MG PO TBDP: ORAL_TABLET | ORAL | 0 refills | Status: AC

## 2023-12-22 MED ORDER — MORPHINE SULFATE 15 MG PO TABS: 7.5000 mg | ORAL_TABLET | ORAL | 0 refills | Status: AC | PRN

## 2023-12-22 MED ORDER — NITROGLYCERIN 0.4 MG SL SUBL
0.4000 mg | SUBLINGUAL_TABLET | SUBLINGUAL | Status: DC | PRN
  Administered 2023-12-22: 0.4 mg via SUBLINGUAL
  Filled 2023-12-22: qty 1

## 2023-12-22 MED ORDER — OXYCODONE HCL 5 MG PO TABS
5.0000 mg | ORAL_TABLET | Freq: Once | ORAL | Status: AC
  Administered 2023-12-22: 5 mg via ORAL
  Filled 2023-12-22: qty 1

## 2023-12-22 NOTE — Discharge Instructions (Signed)
 Take 4 over the counter ibuprofen tablets 3 times a day or 2 over-the-counter naproxen tablets twice a day for pain. Also take tylenol 1000mg (2 extra strength) four times a day.   Then take the pain medicine if you feel like you need it. Narcotics do not help with the pain, they only make you care about it less.  You can become addicted to this, people may break into your house to steal it.  It will constipate you.  If you drive under the influence of this medicine you can get a DUI.

## 2023-12-22 NOTE — ED Triage Notes (Signed)
 Patient has just left the gym and started having left chest pain that radiated into her back.  Patient reports sob, dizziness sweating and nausea associated.  Patient hyperventilating in triage and complaining of facial numbness and bilateral arm numbness.

## 2023-12-22 NOTE — ED Notes (Signed)
 Phleb to obtain didimer recollect

## 2023-12-22 NOTE — ED Provider Notes (Signed)
 [place] EMERGENCY DEPARTMENT AT [facility] Provider Note   CSN: [id number] Arrival date & time: 12/22/23  1454     Patient presents with: Chest Pain   Wendy Terry is a 48 y.o. female.   48 yo F with a chief complaint of chest pain.  Patient had finished working out at the gym and got into the car and then developed some sharp left-sided chest pain.  Seem to radiate to the back.  Seems to come and go.  Nothing seems to make it better or worse.  She developed diffuse weakness and felt she had tingling around her mouth.  She feels like she has gotten slightly better since then but still has had pain come and go.  Before she went to the gym she felt fine.  No issues while exercising.  Patient denies history of MI, denies hypertension hyperlipidemia diabetes or smoking.  Denies family history of MI.  Patient denies history of PE or DVT denies hemoptysis denies unilateral lower extremity edema denies recent surgery immobilization hospitalization estrogen use or history of cancer.     Chest Pain      Prior to Admission medications   Medication Sig Start Date End Date Taking? Authorizing Provider  meclizine  (ANTIVERT ) 25 MG tablet Take 1 tablet (25 mg total) by mouth 3 (three) times daily as needed for dizziness. 12/22/23  Yes Emil Share, DO  morphine  (MSIR) 15 MG tablet Take 0.5 tablets (7.5 mg total) by mouth every 4 (four) hours as needed. 12/22/23  Yes Emil Share, DO  ondansetron  (ZOFRAN -ODT) 4 MG disintegrating tablet 4mg  ODT q4 hours prn nausea/vomit 12/22/23  Yes Emil Share, DO  albuterol  (VENTOLIN  HFA) 108 (90 Base) MCG/ACT inhaler Inhale 2 puffs into the lungs every 6 (six) hours as needed for wheezing or shortness of breath. 08/09/23   White, Shelba SAUNDERS, NP  benzonatate  (TESSALON ) 100 MG capsule Take 1 capsule (100 mg total) by mouth every 8 (eight) hours. 08/09/23   White, Shelba SAUNDERS, NP  citalopram  (CELEXA ) 20 MG tablet Take 1 tablet (20 mg total) by mouth daily. For  anxiety. 12/04/21   Clark, Katherine K, NP  oxycodone  (OXY-IR) 5 MG capsule Take 1 capsule (5 mg total) by mouth every 6 (six) hours as needed. 06/13/23   Teresa Shelba SAUNDERS, NP  promethazine -dextromethorphan (PROMETHAZINE -DM) 6.25-15 MG/5ML syrup Take 5 mLs by mouth at bedtime as needed. 08/09/23   Teresa Shelba SAUNDERS, NP  tretinoin (RETIN-A) 0.025 % cream SMARTSIG:sparingly Topical Every Night 12/30/21   [provider]    Allergies: Hydrocodone     Review of Systems  Cardiovascular:  Positive for chest pain.    Updated Vital Signs BP 114/78   Pulse 60   Temp 98 F (36.7 C) (Oral)   Resp 13   Ht 5' 2.5 (1.588 m)   Wt 79.4 kg   SpO2 99%   BMI 31.50 kg/m   Physical Exam Vitals and nursing note reviewed.  Constitutional:      General: She is not in acute distress.    Appearance: She is well-developed. She is not diaphoretic.  HENT:     Head: Normocephalic and atraumatic.  Eyes:     Pupils: Pupils are equal, round, and reactive to light.  Cardiovascular:     Rate and Rhythm: Normal rate and regular rhythm.     Heart sounds: No murmur heard.    No friction rub. No gallop.  Pulmonary:     Effort: Pulmonary effort is normal. Tachypnea  present.     Breath sounds: No wheezing or rales.  Chest:     Chest wall: No tenderness.     Comments: No obvious discomfort with palpation of the left chest wall. Abdominal:     General: There is no distension.     Palpations: Abdomen is soft.     Tenderness: There is no abdominal tenderness.  Musculoskeletal:        General: No tenderness.     Cervical back: Normal range of motion and neck supple.  Skin:    General: Skin is warm and dry.  Neurological:     Mental Status: She is alert and oriented to person, place, and time.  Psychiatric:        Behavior: Behavior normal.     (all labs ordered are listed, but only abnormal results are displayed) Labs Reviewed  CBC WITH DIFFERENTIAL/PLATELET - Abnormal; Notable for the following  components:      Result Value   Platelets 443 (*)    Eosinophils Absolute 0.7 (*)    All other components within normal limits  BASIC METABOLIC PANEL WITH GFR - Abnormal; Notable for the following components:   CO2 18 (*)    Glucose, Bld 104 (*)    All other components within normal limits  I-STAT CHEM 8, ED - Abnormal; Notable for the following components:   Glucose, Bld 106 (*)    Calcium, Ion 1.07 (*)    TCO2 19 (*)    All other components within normal limits  D-DIMER, QUANTITATIVE (NOT AT [facility])  TROPONIN I (HIGH SENSITIVITY)  TROPONIN I (HIGH SENSITIVITY)    EKG: EKG Interpretation Date/Time:  Thursday December 22 2023 15:02:25 EDT Ventricular Rate:  86 PR Interval:  96 QRS Duration:  90 QT Interval:  414 QTC Calculation: 495 R Axis:   74  Text Interpretation: Sinus rhythm with short PR Prolonged QT Abnormal ECG No significant change since last tracing Confirmed by Emil Share [phone]) on 12/22/2023 3:56:36 PM  Radiology: [facility] Chest 2 View Result Date: 12/22/2023 CLINICAL DATA:  chest pain EXAM: CHEST - 2 VIEW COMPARISON:  Chest x-ray 11/07/2021 FINDINGS: The heart and mediastinal contours are within normal limits. No focal consolidation. No pulmonary edema. No pleural effusion. No pneumothorax. No acute osseous abnormality. IMPRESSION: No active cardiopulmonary disease. Electronically Signed   By: Morgane  Naveau M.D.   On: 12/22/2023 15:58     Procedures   Medications Ordered in the ED  nitroGLYCERIN  (NITROSTAT ) SL tablet 0.4 mg (0.4 mg Sublingual Given 12/22/23 1614)  morphine  (PF) 4 MG/ML injection 4 mg (4 mg Intravenous Not Given 12/22/23 1702)  ondansetron  (ZOFRAN ) injection 4 mg (4 mg Intravenous Not Given 12/22/23 1702)  diazepam  (VALIUM ) tablet 5 mg (5 mg Oral Given 12/22/23 1632)  acetaminophen  (TYLENOL ) tablet 1,000 mg (1,000 mg Oral Given 12/22/23 1705)  oxyCODONE  (Oxy IR/ROXICODONE ) immediate release tablet 5 mg (5 mg Oral Given 12/22/23 1705)                                     Medical Decision Making Amount and/or Complexity of Data Reviewed Labs: ordered.  Risk OTC drugs. Prescription drug management.   48 yo F with a chief complaints of chest pain.  Left-sided pinpoint radiates to the back.  Seems to come and go.  Had developed perioral paresthesias.  Initial troponins negative.  EKG is nonischemic.  Chest x-ray independently interpreted by  me without focal obstructive process.  No anemia no significant electrolyte abnormalities.  Will check second troponin.  She was tachycardic when she went to sit up in the room.  Will send off a D-dimer.  She is otherwise PERC negative.  2 troponins are negative, D-dimer negative.  Patient is feeling better on repeat assessment.  I discussed results with patient and family.  They would like some medication for nausea dizziness and pain medicine.  PCP follow up.   7:55 PM:  I have discussed the diagnosis/risks/treatment options with the patient and family.  Evaluation and diagnostic testing in the emergency department does not suggest an emergent condition requiring admission or immediate intervention beyond what has been performed at this time.  They will follow up with PCP. We also discussed returning to the ED immediately if new or worsening sx occur. We discussed the sx which are most concerning (e.g., sudden worsening pain, fever, inability to tolerate by mouth, hemoptysis, syncope, exertional symptoms) that necessitate immediate return. Medications administered to the patient during their visit and any new prescriptions provided to the patient are listed below.  Medications given during this visit Medications  nitroGLYCERIN  (NITROSTAT ) SL tablet 0.4 mg (0.4 mg Sublingual Given 12/22/23 1614)  morphine  (PF) 4 MG/ML injection 4 mg (4 mg Intravenous Not Given 12/22/23 1702)  ondansetron  (ZOFRAN ) injection 4 mg (4 mg Intravenous Not Given 12/22/23 1702)  diazepam  (VALIUM ) tablet 5 mg (5 mg Oral Given 12/22/23 1632)   acetaminophen  (TYLENOL ) tablet 1,000 mg (1,000 mg Oral Given 12/22/23 1705)  oxyCODONE  (Oxy IR/ROXICODONE ) immediate release tablet 5 mg (5 mg Oral Given 12/22/23 1705)     The patient appears reasonably screen and/or stabilized for discharge and I doubt any other medical condition or other [facility] requiring further screening, evaluation, or treatment in the ED at this time prior to discharge.       Final diagnoses:  Left-sided chest pain    ED Discharge Orders          Ordered    morphine  (MSIR) 15 MG tablet  Every 4 hours PRN        12/22/23 1945    ondansetron  (ZOFRAN -ODT) 4 MG disintegrating tablet        12/22/23 1945    meclizine  (ANTIVERT ) 25 MG tablet  3 times daily PRN        12/22/23 1945               Emil Share, DO 12/22/23 1955

## 2023-12-22 NOTE — ED Provider Triage Note (Signed)
 Emergency Medicine Provider Triage Evaluation Note  Wendy Terry , a 48 y.o. female  was evaluated in triage.  Pt complains of chest pain that started after she left the gym.  Initially she had a fleeting episode however it reoccurred.  This was associated with radiation to her back, nausea, diaphoresis.  Reports history of NSTEMI.  Took 325 mg of aspirin prior to arrival.  Ongoing chest pressure  Review of Systems  Positive: As above Negative: As above  Physical Exam  BP (!) 124/97 (BP Location: Right Arm)   Pulse 87   Temp 98.1 F (36.7 C)   Resp 20   SpO2 99%  Gen:   Awake, no distress   Resp:  Normal effort  MSK:   Moves extremities without difficulty  Other:    Medical Decision Making  Medically screening exam initiated at 3:07 PM.  Appropriate orders placed.  Wendy Terry was informed that the remainder of the evaluation will be completed by another provider, this initial triage assessment does not replace that evaluation, and the importance of remaining in the ED until their evaluation is complete.    Wendy Loge, PA-C 12/22/23 [phone]

## 2024-03-21 ENCOUNTER — Encounter: Payer: Self-pay | Admitting: Emergency Medicine

## 2024-03-21 ENCOUNTER — Ambulatory Visit
Admission: EM | Admit: 2024-03-21 | Discharge: 2024-03-21 | Disposition: A | Discharge status: Home / Self Care | Payer: Self-pay | Attending: Emergency Medicine | Admitting: Emergency Medicine

## 2024-03-21 DIAGNOSIS — L259 Unspecified contact dermatitis, unspecified cause: Secondary | ICD-10-CM

## 2024-03-21 MED ORDER — TRIAMCINOLONE ACETONIDE 0.1 % EX CREA: 1.0000 | TOPICAL_CREAM | Freq: Two times a day (BID) | CUTANEOUS | 0 refills | Status: AC
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
## 2024-03-21 NOTE — Discharge Instructions (Addendum)
 Use the triamcinolone cream as directed for up to 7 days.    Follow-up with your primary care provider if your symptoms are not improving.
